# Patient Record
Sex: Female | Born: 1990 | Race: Black or African American | Hispanic: No | Marital: Married | Smoking: Never smoker
Health system: Southern US, Community
[De-identification: ages and names within clinical notes are randomized; demographics above are authoritative.]

## PROBLEM LIST (undated history)

## (undated) ENCOUNTER — Inpatient Hospital Stay (HOSPITAL_COMMUNITY): Payer: Self-pay

## (undated) DIAGNOSIS — IMO0002 Reserved for concepts with insufficient information to code with codable children: Secondary | ICD-10-CM

## (undated) DIAGNOSIS — I10 Essential (primary) hypertension: Secondary | ICD-10-CM

## (undated) DIAGNOSIS — R87619 Unspecified abnormal cytological findings in specimens from cervix uteri: Secondary | ICD-10-CM

## (undated) HISTORY — PX: TONSILLECTOMY: SUR1361

## (undated) HISTORY — PX: HERNIA REPAIR: SHX51

## (undated) HISTORY — PX: ADENOIDECTOMY: SUR15

---

## 2010-04-06 ENCOUNTER — Emergency Department (HOSPITAL_BASED_OUTPATIENT_CLINIC_OR_DEPARTMENT_OTHER): Admission: EM | Admit: 2010-04-06 | Discharge: 2010-04-06 | Payer: Self-pay | Admitting: Emergency Medicine

## 2010-07-23 ENCOUNTER — Emergency Department (HOSPITAL_BASED_OUTPATIENT_CLINIC_OR_DEPARTMENT_OTHER)
Admission: EM | Admit: 2010-07-23 | Discharge: 2010-07-23 | Disposition: A | Discharge status: Home / Self Care | Payer: Medicaid Other | Attending: Emergency Medicine | Admitting: Emergency Medicine

## 2010-07-23 DIAGNOSIS — X58XXXA Exposure to other specified factors, initial encounter: Secondary | ICD-10-CM | POA: Insufficient documentation

## 2010-07-23 DIAGNOSIS — IMO0002 Reserved for concepts with insufficient information to code with codable children: Secondary | ICD-10-CM | POA: Insufficient documentation

## 2010-07-23 DIAGNOSIS — O2690 Pregnancy related conditions, unspecified, unspecified trimester: Secondary | ICD-10-CM | POA: Insufficient documentation

## 2010-07-23 LAB — URINALYSIS, ROUTINE W REFLEX MICROSCOPIC
Protein, ur: NEGATIVE mg/dL
Specific Gravity, Urine: 1.016 (ref 1.005–1.030)
Urine Glucose, Fasting: NEGATIVE mg/dL
pH: 7.5 (ref 5.0–8.0)

## 2010-07-23 LAB — URINE MICROSCOPIC-ADD ON

## 2010-07-24 LAB — GC/CHLAMYDIA PROBE AMP, GENITAL: Chlamydia, DNA Probe: NEGATIVE

## 2010-09-04 ENCOUNTER — Emergency Department (HOSPITAL_BASED_OUTPATIENT_CLINIC_OR_DEPARTMENT_OTHER)
Admission: EM | Admit: 2010-09-04 | Discharge: 2010-09-04 | Disposition: A | Discharge status: Home / Self Care | Payer: Medicaid Other | Attending: Emergency Medicine | Admitting: Emergency Medicine

## 2010-09-04 DIAGNOSIS — R109 Unspecified abdominal pain: Secondary | ICD-10-CM | POA: Insufficient documentation

## 2010-09-04 DIAGNOSIS — N390 Urinary tract infection, site not specified: Secondary | ICD-10-CM | POA: Insufficient documentation

## 2010-09-04 DIAGNOSIS — O2390 Unspecified genitourinary tract infection in pregnancy, unspecified trimester: Secondary | ICD-10-CM | POA: Insufficient documentation

## 2010-09-04 LAB — URINALYSIS, ROUTINE W REFLEX MICROSCOPIC
Glucose, UA: NEGATIVE mg/dL
Nitrite: NEGATIVE
Specific Gravity, Urine: 1.025 (ref 1.005–1.030)
pH: 5.5 (ref 5.0–8.0)

## 2010-09-04 LAB — URINE MICROSCOPIC-ADD ON

## 2010-10-01 ENCOUNTER — Inpatient Hospital Stay (HOSPITAL_COMMUNITY)
Admission: AD | Admit: 2010-10-01 | Discharge: 2010-10-02 | Disposition: A | Discharge status: Home / Self Care | Payer: Medicaid Other | Source: Ambulatory Visit | Attending: Obstetrics & Gynecology | Admitting: Obstetrics & Gynecology

## 2010-10-01 DIAGNOSIS — O479 False labor, unspecified: Secondary | ICD-10-CM | POA: Insufficient documentation

## 2010-10-05 ENCOUNTER — Inpatient Hospital Stay (HOSPITAL_COMMUNITY)
Admission: AD | Admit: 2010-10-05 | Discharge: 2010-10-05 | Disposition: A | Discharge status: Home / Self Care | Payer: Medicaid Other | Source: Ambulatory Visit | Attending: Obstetrics and Gynecology | Admitting: Obstetrics and Gynecology

## 2010-10-05 ENCOUNTER — Emergency Department (HOSPITAL_BASED_OUTPATIENT_CLINIC_OR_DEPARTMENT_OTHER)
Admission: EM | Admit: 2010-10-05 | Discharge: 2010-10-05 | Disposition: A | Discharge status: Home / Self Care | Payer: Medicaid Other | Attending: Emergency Medicine | Admitting: Emergency Medicine

## 2010-10-05 DIAGNOSIS — M545 Low back pain, unspecified: Secondary | ICD-10-CM | POA: Insufficient documentation

## 2010-10-05 DIAGNOSIS — O2690 Pregnancy related conditions, unspecified, unspecified trimester: Secondary | ICD-10-CM | POA: Insufficient documentation

## 2010-10-05 DIAGNOSIS — I10 Essential (primary) hypertension: Secondary | ICD-10-CM | POA: Insufficient documentation

## 2010-10-05 DIAGNOSIS — O4700 False labor before 37 completed weeks of gestation, unspecified trimester: Secondary | ICD-10-CM | POA: Insufficient documentation

## 2010-10-05 LAB — URINALYSIS, ROUTINE W REFLEX MICROSCOPIC
Glucose, UA: NEGATIVE mg/dL
Protein, ur: NEGATIVE mg/dL
pH: 7.5 (ref 5.0–8.0)

## 2010-10-07 LAB — URINE CULTURE: Colony Count: NO GROWTH

## 2010-10-17 ENCOUNTER — Inpatient Hospital Stay (HOSPITAL_COMMUNITY)
Admission: AD | Admit: 2010-10-17 | Discharge: 2010-10-18 | Disposition: A | Discharge status: Home / Self Care | Payer: Medicaid Other | Source: Ambulatory Visit | Attending: Obstetrics & Gynecology | Admitting: Obstetrics & Gynecology

## 2010-10-17 DIAGNOSIS — O479 False labor, unspecified: Secondary | ICD-10-CM | POA: Insufficient documentation

## 2010-10-17 LAB — CBC
Hemoglobin: 10.9 g/dL — ABNORMAL LOW (ref 12.0–15.0)
MCH: 32.7 pg (ref 26.0–34.0)
MCV: 98.2 fL (ref 78.0–100.0)
Platelets: 294 10*3/uL (ref 150–400)
RBC: 3.33 MIL/uL — ABNORMAL LOW (ref 3.87–5.11)

## 2010-10-18 LAB — COMPREHENSIVE METABOLIC PANEL
BUN: 5 mg/dL — ABNORMAL LOW (ref 6–23)
CO2: 19 mEq/L (ref 19–32)
Chloride: 101 mEq/L (ref 96–112)
Creatinine, Ser: 0.65 mg/dL (ref 0.4–1.2)
GFR calc non Af Amer: >60 mL/min (ref >60)
Glucose, Bld: 64 mg/dL — ABNORMAL LOW (ref 70–99)
Total Bilirubin: 0.6 mg/dL (ref 0.3–1.2)

## 2010-10-21 ENCOUNTER — Inpatient Hospital Stay (HOSPITAL_COMMUNITY)
Admission: AD | Admit: 2010-10-21 | Discharge: 2010-10-24 | DRG: 775 | Disposition: A | Discharge status: Home / Self Care | Payer: Managed Care, Other (non HMO) | Source: Ambulatory Visit | Attending: Obstetrics and Gynecology | Admitting: Obstetrics and Gynecology

## 2010-10-21 DIAGNOSIS — Z22330 Carrier of Group B streptococcus: Secondary | ICD-10-CM

## 2010-10-21 DIAGNOSIS — O99892 Other specified diseases and conditions complicating childbirth: Secondary | ICD-10-CM | POA: Diagnosis present

## 2010-10-22 LAB — COMPREHENSIVE METABOLIC PANEL
ALT: 11 U/L (ref 0–35)
Albumin: 2 g/dL — ABNORMAL LOW (ref 3.5–5.2)
Alkaline Phosphatase: 289 U/L — ABNORMAL HIGH (ref 39–117)
BUN: 5 mg/dL — ABNORMAL LOW (ref 6–23)
Chloride: 98 mEq/L (ref 96–112)
Glucose, Bld: 87 mg/dL (ref 70–99)
Potassium: 3.6 mEq/L (ref 3.5–5.1)
Total Bilirubin: 0.8 mg/dL (ref 0.3–1.2)

## 2010-10-22 LAB — CBC
HCT: 31.3 % — ABNORMAL LOW (ref 36.0–46.0)
Hemoglobin: 11.6 g/dL — ABNORMAL LOW (ref 12.0–15.0)
MCV: 100 fL (ref 78.0–100.0)
RBC: 3.13 MIL/uL — ABNORMAL LOW (ref 3.87–5.11)
RBC: 3.47 MIL/uL — ABNORMAL LOW (ref 3.87–5.11)
WBC: 13.9 10*3/uL — ABNORMAL HIGH (ref 4.0–10.5)
WBC: 15.8 10*3/uL — ABNORMAL HIGH (ref 4.0–10.5)

## 2010-10-22 LAB — URIC ACID: Uric Acid, Serum: 6.1 mg/dL (ref 2.4–7.0)

## 2010-10-22 LAB — LACTATE DEHYDROGENASE: LDH: 203 U/L (ref 94–250)

## 2010-10-23 LAB — CBC
HCT: 27.5 % — ABNORMAL LOW (ref 36.0–46.0)
MCH: 32.9 pg (ref 26.0–34.0)
MCV: 98.2 fL (ref 78.0–100.0)
Platelets: 281 10*3/uL (ref 150–400)
RBC: 2.8 MIL/uL — ABNORMAL LOW (ref 3.87–5.11)

## 2012-06-10 NOTE — L&D Delivery Note (Signed)
Patient was C/C/+2 and pushed for 5 minutes with epidural.   NSVD  female infant, Apgars 9,9, weight P.   The patient had one first degree L labial lacerations repaired with 2-0 vicryl. Fundus was firm. EBL was expected. Placenta was delivered intact. Vagina was clear.  Baby was vigorous to bedside.  Treven Holtman A

## 2012-09-16 LAB — OB RESULTS CONSOLE ABO/RH: RH Type: POSITIVE

## 2012-09-16 LAB — OB RESULTS CONSOLE RUBELLA ANTIBODY, IGM: Rubella: IMMUNE

## 2012-09-16 LAB — OB RESULTS CONSOLE RPR: RPR: NONREACTIVE

## 2012-09-16 LAB — OB RESULTS CONSOLE HEPATITIS B SURFACE ANTIGEN: Hepatitis B Surface Ag: NEGATIVE

## 2012-09-16 LAB — OB RESULTS CONSOLE HIV ANTIBODY (ROUTINE TESTING): HIV: NONREACTIVE

## 2013-03-20 ENCOUNTER — Inpatient Hospital Stay (HOSPITAL_COMMUNITY)
Admission: AD | Admit: 2013-03-20 | Discharge: 2013-03-22 | DRG: 775 | Disposition: A | Discharge status: Home / Self Care | Payer: Managed Care, Other (non HMO) | Source: Ambulatory Visit | Attending: Obstetrics and Gynecology | Admitting: Obstetrics and Gynecology

## 2013-03-20 ENCOUNTER — Encounter (HOSPITAL_COMMUNITY): Payer: Managed Care, Other (non HMO) | Admitting: Anesthesiology

## 2013-03-20 ENCOUNTER — Inpatient Hospital Stay (HOSPITAL_COMMUNITY): Payer: Managed Care, Other (non HMO) | Admitting: Anesthesiology

## 2013-03-20 ENCOUNTER — Encounter (HOSPITAL_COMMUNITY): Payer: Self-pay

## 2013-03-20 HISTORY — DX: Reserved for concepts with insufficient information to code with codable children: IMO0002

## 2013-03-20 HISTORY — DX: Essential (primary) hypertension: I10

## 2013-03-20 HISTORY — DX: Unspecified abnormal cytological findings in specimens from cervix uteri: R87.619

## 2013-03-20 LAB — CBC
HCT: 32.6 % — ABNORMAL LOW (ref 36.0–46.0)
Hemoglobin: 11 g/dL — ABNORMAL LOW (ref 12.0–15.0)
MCH: 31.7 pg (ref 26.0–34.0)
MCHC: 33.7 g/dL (ref 30.0–36.0)
MCV: 93.9 fL (ref 78.0–100.0)
RBC: 3.47 MIL/uL — ABNORMAL LOW (ref 3.87–5.11)
RDW: 12.7 % (ref 11.5–15.5)

## 2013-03-20 MED ORDER — OXYTOCIN BOLUS FROM INFUSION: 500.0000 mL | INTRAVENOUS | Status: DC

## 2013-03-20 MED ORDER — LACTATED RINGERS IV SOLN: 500.0000 mL | Freq: Once | INTRAVENOUS | Status: DC

## 2013-03-20 MED ORDER — ONDANSETRON HCL 4 MG/2ML IJ SOLN: 4.0000 mg | Freq: Four times a day (QID) | INTRAMUSCULAR | Status: DC | PRN

## 2013-03-20 MED ORDER — EPHEDRINE 5 MG/ML INJ
10.0000 mg | INTRAVENOUS | Status: DC | PRN
  Filled 2013-03-20: qty 2

## 2013-03-20 MED ORDER — EPHEDRINE 5 MG/ML INJ
10.0000 mg | INTRAVENOUS | Status: DC | PRN
  Filled 2013-03-20: qty 4
  Filled 2013-03-20: qty 2

## 2013-03-20 MED ORDER — PHENYLEPHRINE 40 MCG/ML (10ML) SYRINGE FOR IV PUSH (FOR BLOOD PRESSURE SUPPORT)
80.0000 ug | PREFILLED_SYRINGE | INTRAVENOUS | Status: DC | PRN
  Filled 2013-03-20: qty 2

## 2013-03-20 MED ORDER — ACETAMINOPHEN 325 MG PO TABS: 650.0000 mg | ORAL_TABLET | ORAL | Status: DC | PRN

## 2013-03-20 MED ORDER — OXYTOCIN 40 UNITS IN LACTATED RINGERS INFUSION - SIMPLE MED
1.0000 m[IU]/min | INTRAVENOUS | Status: DC
  Administered 2013-03-20: 2 m[IU]/min via INTRAVENOUS
  Filled 2013-03-20: qty 1000

## 2013-03-20 MED ORDER — CITRIC ACID-SODIUM CITRATE 334-500 MG/5ML PO SOLN: 30.0000 mL | ORAL | Status: DC | PRN

## 2013-03-20 MED ORDER — OXYTOCIN 40 UNITS IN LACTATED RINGERS INFUSION - SIMPLE MED: 62.5000 mL/h | INTRAVENOUS | Status: DC

## 2013-03-20 MED ORDER — LACTATED RINGERS IV SOLN: 500.0000 mL | INTRAVENOUS | Status: DC | PRN

## 2013-03-20 MED ORDER — PENICILLIN G POTASSIUM 5000000 UNITS IJ SOLR
2.5000 10*6.[IU] | INTRAVENOUS | Status: DC
  Administered 2013-03-20 (×2): 2.5 10*6.[IU] via INTRAVENOUS
  Filled 2013-03-20 (×6): qty 2.5

## 2013-03-20 MED ORDER — FENTANYL 2.5 MCG/ML BUPIVACAINE 1/10 % EPIDURAL INFUSION (WH - ANES)
14.0000 mL/h | INTRAMUSCULAR | Status: DC | PRN
  Administered 2013-03-20: 14 mL/h via EPIDURAL
  Administered 2013-03-20: 12 mL/h via EPIDURAL
  Filled 2013-03-20 (×2): qty 125

## 2013-03-20 MED ORDER — FLEET ENEMA 7-19 GM/118ML RE ENEM: 1.0000 | ENEMA | RECTAL | Status: DC | PRN

## 2013-03-20 MED ORDER — PHENYLEPHRINE 40 MCG/ML (10ML) SYRINGE FOR IV PUSH (FOR BLOOD PRESSURE SUPPORT)
80.0000 ug | PREFILLED_SYRINGE | INTRAVENOUS | Status: DC | PRN
  Filled 2013-03-20: qty 5
  Filled 2013-03-20: qty 2

## 2013-03-20 MED ORDER — LIDOCAINE HCL (PF) 1 % IJ SOLN
30.0000 mL | INTRAMUSCULAR | Status: DC | PRN
  Filled 2013-03-20 (×2): qty 30

## 2013-03-20 MED ORDER — DIPHENHYDRAMINE HCL 50 MG/ML IJ SOLN
12.5000 mg | INTRAMUSCULAR | Status: AC | PRN
  Administered 2013-03-20 (×3): 12.5 mg via INTRAVENOUS
  Filled 2013-03-20: qty 1

## 2013-03-20 MED ORDER — OXYCODONE-ACETAMINOPHEN 5-325 MG PO TABS: 1.0000 | ORAL_TABLET | ORAL | Status: DC | PRN

## 2013-03-20 MED ORDER — PENICILLIN G POTASSIUM 5000000 UNITS IJ SOLR
5.0000 10*6.[IU] | Freq: Once | INTRAVENOUS | Status: AC
  Administered 2013-03-20: 5 10*6.[IU] via INTRAVENOUS
  Filled 2013-03-20: qty 5

## 2013-03-20 MED ORDER — LIDOCAINE HCL (PF) 1 % IJ SOLN
INTRAMUSCULAR | Status: DC | PRN
  Administered 2013-03-20 (×3): 4 mL

## 2013-03-20 MED ORDER — IBUPROFEN 600 MG PO TABS
600.0000 mg | ORAL_TABLET | Freq: Four times a day (QID) | ORAL | Status: DC | PRN
  Administered 2013-03-21: 600 mg via ORAL
  Filled 2013-03-20: qty 1

## 2013-03-20 MED ORDER — TERBUTALINE SULFATE 1 MG/ML IJ SOLN: 0.2500 mg | Freq: Once | INTRAMUSCULAR | Status: AC | PRN

## 2013-03-20 MED ORDER — LACTATED RINGERS IV SOLN
INTRAVENOUS | Status: DC
  Administered 2013-03-20: 23:00:00 via INTRAVENOUS
  Administered 2013-03-20: 125 mL/h via INTRAVENOUS

## 2013-03-20 NOTE — Anesthesia Preprocedure Evaluation (Signed)

## 2013-03-20 NOTE — Progress Notes (Signed)
Pt still concerning regarding breathing CRNA called and given history.  Requested CRNA to come and check pt

## 2013-03-20 NOTE — MAU Provider Note (Signed)
S: 22 y.o. G2P1 @ [redacted]w[redacted]d presents to MAU for labor evaluation.  She reports leakage of fluid and blood into toilet at home.  She has not leaked since this occurrence and is not requiring a pad for leakage.  She reports good fetal movement, denies vaginal itching/burning, urinary symptoms, h/a, dizziness, n/v, or fever/chills.    O: BP 128/73  Pulse 112  Temp(Src) 98.1 F (36.7 C) (Oral)  Resp 16  Ht 4\' 11"  (1.499 m)  Wt 71.442 kg (157 lb 8 oz)  BMI 31.79 kg/m2  Dilation: 4 Effacement (%): 70 Cervical Position: Middle Station: -3 Presentation: Vertex Exam by:: Leftwich-Kirby, CNM  Speculum exam: Negative pooling with cough/bearing down, scant/small amount of dark brown blood mixed with mucus noted, scant brown mucus on glove following exam  Ferning negative  A: Labor evaluation  P: RN to recheck, call Dr Nils Flack Certified Nurse-Midwife

## 2013-03-20 NOTE — Progress Notes (Signed)
Pt changed to 5 cm;  Admit for labor.

## 2013-03-20 NOTE — MAU Note (Signed)
22 yo, G2P1 at [redacted]w[redacted]d, presents to MAU with c/o seeing a small blood clot this morning with wiping. Last intercourse Tuesday; reports she was 3 cm at visit on Tuesday. Reports having some contractions this morning. Reports +FM. Denies HSV; denies LOF.

## 2013-03-20 NOTE — Progress Notes (Signed)
22 y.o. [name] comes in c/o labor and possible LOF. Otherwise has good fetal movement and no bleeding.  History reviewed. No pertinent past medical history.  Past Surgical History   Procedure  Laterality  Date   .  Hernia repair     .  Tonsillectomy     .  Adenoidectomy      OB History   Gravida  Para  Term  Preterm  AB  SAB  TAB  Ectopic  Multiple  Living   2  1         1     #  Outcome  Date  GA  Lbr Len/2nd  Weight  Sex  Delivery  Anes  PTL  Lv   2  CUR            1  PAR  2012     M  SVD   N  Y      History    Social History   .  Marital Status:  Single     Spouse Name:  N/[name]     Number of Children:  N/[name]   .  Years of Education:  N/[name]    Occupational History   .  Not on file.    Social History Main Topics   .  Smoking status:  Never Smoker   .  Smokeless tobacco:  Never Used   .  Alcohol Use:  No   .  Drug Use:  No   .  Sexual Activity:  Yes     Birth Control/ Protection:  None    Other Topics  Concern   .  Not on file    Social History Narrative   .  No narrative on file   Review of patient's allergies indicates no known allergies.  Prenatal Transfer Tool   Maternal Diabetes: No  Genetic Screening: Normal  Maternal Ultrasounds/Referrals: Normal  Fetal Ultrasounds or other Referrals: None  Maternal Substance Abuse: No  Significant Maternal Medications: None  Significant Maternal Lab Results: None  Other PNC:uncomp  Filed Vitals:    03/20/13 0820   BP:  128/73   Pulse:  112   Temp:  98.1 F (36.7 C)   Resp:  16    Lungs/Cor: NAD  Abdomen: soft, gravid  Ex: no cords, erythema  SVE: 4/70/-2  SSE per NP no ferning or pool- not ruptured.  FHTs: 130, good STV, NST R  Toco: q10  [name], early labor vs BRHX. No LOF. Pt to walk and return for recheck.  GBS pos.  Michele [name]  

## 2013-03-20 NOTE — Progress Notes (Signed)
Pt states she is having increased difficulty breathing.  Pt placed on pulse ox.  Instructed she is oxygenating at 99%.  Exlplained to pt that with epidural sometimes some of the accessory muscles used for breathing are relaxed  Pt verbalized understanding

## 2013-03-20 NOTE — Anesthesia Procedure Notes (Signed)
Epidural Patient location during procedure: OB Start time: 03/20/2013 1:03 PM  Staffing Performed by: anesthesiologist   Preanesthetic Checklist Completed: patient identified, site marked, surgical consent, pre-op evaluation, timeout performed, IV checked, risks and benefits discussed and monitors and equipment checked  Epidural Patient position: sitting Prep: site prepped and draped and DuraPrep Patient monitoring: continuous pulse ox and blood pressure Approach: midline Injection technique: LOR air  Needle:  Needle type: Tuohy  Needle gauge: 17 G Needle length: 9 cm and 9 Needle insertion depth: 4.5 cm Catheter type: closed end flexible Catheter size: 19 Gauge Catheter at skin depth: 9.5 cm Test dose: negative  Assessment Events: blood not aspirated, injection not painful, no injection resistance, negative IV test and no paresthesia  Additional Notes Discussed risk of headache, infection, bleeding, nerve injury and failed or incomplete block.  Patient voices understanding and wishes to proceed.  Epidural placed easily on first attempt.  No paresthesia.  Patient tolerated procedure well with no apparent complications. Jasmine December, MD Reason for block:procedure for pain

## 2013-03-20 NOTE — Progress Notes (Signed)
CRNA in room to check on pt

## 2013-03-20 NOTE — MAU Note (Signed)
Pt states here for possible ROM this am. Clear fluid mixed with small amount of blood. Did note small clot and was concerned. Was 3cm in office Tuesday.

## 2013-03-21 ENCOUNTER — Encounter (HOSPITAL_COMMUNITY): Payer: Self-pay | Admitting: *Deleted

## 2013-03-21 LAB — CBC
HCT: 28.3 % — ABNORMAL LOW (ref 36.0–46.0)
Hemoglobin: 9.5 g/dL — ABNORMAL LOW (ref 12.0–15.0)
MCHC: 33.6 g/dL (ref 30.0–36.0)
MCV: 95 fL (ref 78.0–100.0)
RBC: 2.98 MIL/uL — ABNORMAL LOW (ref 3.87–5.11)
WBC: 13.1 10*3/uL — ABNORMAL HIGH (ref 4.0–10.5)

## 2013-03-21 MED ORDER — MAGNESIUM HYDROXIDE 400 MG/5ML PO SUSP: 30.0000 mL | ORAL | Status: DC | PRN

## 2013-03-21 MED ORDER — LANOLIN HYDROUS EX OINT: TOPICAL_OINTMENT | CUTANEOUS | Status: DC | PRN

## 2013-03-21 MED ORDER — OXYCODONE-ACETAMINOPHEN 5-325 MG PO TABS
1.0000 | ORAL_TABLET | ORAL | Status: DC | PRN
  Administered 2013-03-21 (×3): 1 via ORAL
  Filled 2013-03-21 (×3): qty 1

## 2013-03-21 MED ORDER — METHYLERGONOVINE MALEATE 0.2 MG PO TABS: 0.2000 mg | ORAL_TABLET | ORAL | Status: DC | PRN

## 2013-03-21 MED ORDER — ONDANSETRON HCL 4 MG/2ML IJ SOLN: 4.0000 mg | INTRAMUSCULAR | Status: DC | PRN

## 2013-03-21 MED ORDER — SENNOSIDES-DOCUSATE SODIUM 8.6-50 MG PO TABS
2.0000 | ORAL_TABLET | ORAL | Status: DC
  Administered 2013-03-22: 2 via ORAL
  Filled 2013-03-21: qty 2

## 2013-03-21 MED ORDER — ONDANSETRON HCL 4 MG PO TABS: 4.0000 mg | ORAL_TABLET | ORAL | Status: DC | PRN

## 2013-03-21 MED ORDER — BENZOCAINE-MENTHOL 20-0.5 % EX AERO
1.0000 "application " | INHALATION_SPRAY | CUTANEOUS | Status: DC | PRN
  Administered 2013-03-21: 1 via TOPICAL
  Filled 2013-03-21: qty 56

## 2013-03-21 MED ORDER — MEASLES, MUMPS & RUBELLA VAC ~~LOC~~ INJ
0.5000 mL | INJECTION | Freq: Once | SUBCUTANEOUS | Status: DC
  Filled 2013-03-21: qty 0.5

## 2013-03-21 MED ORDER — DIBUCAINE 1 % RE OINT: 1.0000 "application " | TOPICAL_OINTMENT | RECTAL | Status: DC | PRN

## 2013-03-21 MED ORDER — SIMETHICONE 80 MG PO CHEW: 80.0000 mg | CHEWABLE_TABLET | ORAL | Status: DC | PRN

## 2013-03-21 MED ORDER — METHYLERGONOVINE MALEATE 0.2 MG/ML IJ SOLN
0.2000 mg | INTRAMUSCULAR | Status: DC | PRN
Start: 2013-03-21 — End: 2013-03-22

## 2013-03-21 MED ORDER — ZOLPIDEM TARTRATE 5 MG PO TABS: 5.0000 mg | ORAL_TABLET | Freq: Every evening | ORAL | Status: DC | PRN

## 2013-03-21 MED ORDER — TETANUS-DIPHTH-ACELL PERTUSSIS 5-2.5-18.5 LF-MCG/0.5 IM SUSP: 0.5000 mL | Freq: Once | INTRAMUSCULAR | Status: DC

## 2013-03-21 MED ORDER — FERROUS SULFATE 325 (65 FE) MG PO TABS
325.0000 mg | ORAL_TABLET | Freq: Two times a day (BID) | ORAL | Status: DC
  Administered 2013-03-21 – 2013-03-22 (×3): 325 mg via ORAL
  Filled 2013-03-21 (×3): qty 1

## 2013-03-21 MED ORDER — PRENATAL MULTIVITAMIN CH
1.0000 | ORAL_TABLET | Freq: Every day | ORAL | Status: DC
  Administered 2013-03-21 – 2013-03-22 (×2): 1 via ORAL
  Filled 2013-03-21 (×2): qty 1

## 2013-03-21 MED ORDER — DIPHENHYDRAMINE HCL 25 MG PO CAPS: 25.0000 mg | ORAL_CAPSULE | Freq: Four times a day (QID) | ORAL | Status: DC | PRN

## 2013-03-21 MED ORDER — WITCH HAZEL-GLYCERIN EX PADS
1.0000 "application " | MEDICATED_PAD | CUTANEOUS | Status: DC | PRN
  Administered 2013-03-21: 1 via TOPICAL

## 2013-03-21 MED ORDER — SODIUM CHLORIDE 0.9 % IJ SOLN: 3.0000 mL | INTRAMUSCULAR | Status: DC | PRN

## 2013-03-21 MED ORDER — SODIUM CHLORIDE 0.9 % IV SOLN: 250.0000 mL | INTRAVENOUS | Status: DC | PRN

## 2013-03-21 MED ORDER — SODIUM CHLORIDE 0.9 % IJ SOLN: 3.0000 mL | Freq: Two times a day (BID) | INTRAMUSCULAR | Status: DC

## 2013-03-21 MED ORDER — IBUPROFEN 800 MG PO TABS
800.0000 mg | ORAL_TABLET | Freq: Three times a day (TID) | ORAL | Status: DC
  Administered 2013-03-21 – 2013-03-22 (×4): 800 mg via ORAL
  Filled 2013-03-21 (×4): qty 1

## 2013-03-21 NOTE — Anesthesia Postprocedure Evaluation (Signed)
Anesthesia Post Note  Patient: Michele [name]  Procedure(s) Performed: * No procedures listed *  Anesthesia type: Epidural  Patient location: Mother/Baby  Post pain: Pain level controlled  Post assessment: Post-op Vital signs reviewed  Last Vitals:  Filed Vitals:   03/21/13 0415  BP: 120/76  Pulse: 87  Temp: 36.7 C  Resp: 18    Post vital signs: Reviewed  Level of consciousness:alert  Complications: No apparent anesthesia complications

## 2013-03-21 NOTE — Progress Notes (Signed)
Patient is eating, ambulating, voiding.  Pain control is good.  Filed Vitals:   03/21/13 0231 03/21/13 0315 03/21/13 0415 03/21/13 0915  BP: 136/86 127/97 120/76 129/72  Pulse: 96 83 87 71  Temp:  98.5 F (36.9 C) 98.1 F (36.7 C) 97.6 F (36.4 C)  TempSrc:  Oral Oral Oral  Resp: 18 20 18 18   Height:      Weight:      SpO2:  96% 97%     Fundus firm Perineum without swelling.  Lab Results  Component Value Date   WBC 13.1* 03/21/2013   HGB 9.5* 03/21/2013   HCT 28.3* 03/21/2013   MCV 95.0 03/21/2013   PLT 190 03/21/2013    O/Positive/-- (04/09 0000)/RI  A/P Post partum day 1.  Routine care.  Expect d/c tomorrow.  Circ at office  Monaye Blackie A

## 2013-03-22 NOTE — Discharge Summary (Signed)
Obstetric Discharge Summary Reason for Admission: onset of labor Prenatal Procedures: ultrasound Intrapartum Procedures: spontaneous vaginal delivery Postpartum Procedures: none Complications-Operative and Postpartum: 1st degree labial Hemoglobin  Date Value Range Status  03/21/2013 9.5* 12.0 - 15.0 g/dL Final     HCT  Date Value Range Status  03/21/2013 28.3* 36.0 - 46.0 % Final    Physical Exam:  General: alert and cooperative Lochia: appropriate Uterine Fundus: firm DVT Evaluation: No evidence of DVT seen on physical exam.  Discharge Diagnoses: Term Pregnancy-delivered  Discharge Information: Date: 03/22/2013 Activity: pelvic rest Diet: routine Medications: PNV and Ibuprofen Condition: stable Instructions: refer to practice specific booklet Discharge to: home Follow-up Information   Follow up with [name],[name], MD In 4 weeks.   Specialty:  Obstetrics and Gynecology   Contact information:   [address] [place] [address] [phone]       Newborn Data: Live born female  Birth Weight: 6 lb 10.5 oz (3020 g) APGAR: 9, 9  Home with mother.  Michele [name] 03/22/2013, 10:14 AM

## 2013-03-30 NOTE — H&P (Signed)
22 y.o. [name] comes in c/o labor and possible LOF. Otherwise has good fetal movement and no bleeding.  History reviewed. No pertinent past medical history.  Past Surgical History   Procedure  Laterality  Date   .  Hernia repair     .  Tonsillectomy     .  Adenoidectomy      OB History   Gravida  Para  Term  Preterm  AB  SAB  TAB  Ectopic  Multiple  Living   2  1         1      #  Outcome  Date  GA  Lbr Len/2nd  Weight  Sex  Delivery  Anes  PTL  Lv   2  CUR            1  PAR  2012     M  SVD   N  Y      History    Social History   .  Marital Status:  Single     Spouse Name:  N/[name]     Number of Children:  N/[name]   .  Years of Education:  N/[name]    Occupational History   .  Not on file.    Social History Main Topics   .  Smoking status:  Never Smoker   .  Smokeless tobacco:  Never Used   .  Alcohol Use:  No   .  Drug Use:  No   .  Sexual Activity:  Yes     Birth Control/ Protection:  None    Other Topics  Concern   .  Not on file    Social History Narrative   .  No narrative on file   Review of patient's allergies indicates no known allergies.  Prenatal Transfer Tool   Maternal Diabetes: No  Genetic Screening: Normal  Maternal Ultrasounds/Referrals: Normal  Fetal Ultrasounds or other Referrals: None  Maternal Substance Abuse: No  Significant Maternal Medications: None  Significant Maternal Lab Results: None  Other JYN:WGNFAO  Filed Vitals:    03/20/13 0820   BP:  128/73   Pulse:  112   Temp:  98.1 F (36.7 C)   Resp:  16    Lungs/Cor: NAD  Abdomen: soft, gravid  Ex: no cords, erythema  SVE: 4/70/-2  SSE per NP no ferning or pool- not ruptured.  FHTs: 130, good STV, NST R  Toco: q10  [name], early labor vs BRHX. No LOF. Pt to walk and return for recheck.  GBS pos.  Michele [name]

## 2013-10-24 ENCOUNTER — Emergency Department (HOSPITAL_BASED_OUTPATIENT_CLINIC_OR_DEPARTMENT_OTHER)
Admission: EM | Admit: 2013-10-24 | Discharge: 2013-10-25 | Disposition: A | Discharge status: Home / Self Care | Payer: Managed Care, Other (non HMO) | Attending: Emergency Medicine | Admitting: Emergency Medicine

## 2013-10-24 ENCOUNTER — Encounter (HOSPITAL_BASED_OUTPATIENT_CLINIC_OR_DEPARTMENT_OTHER): Payer: Self-pay | Admitting: Emergency Medicine

## 2013-10-24 ENCOUNTER — Emergency Department (HOSPITAL_BASED_OUTPATIENT_CLINIC_OR_DEPARTMENT_OTHER): Payer: Managed Care, Other (non HMO)

## 2013-10-24 DIAGNOSIS — I10 Essential (primary) hypertension: Secondary | ICD-10-CM | POA: Insufficient documentation

## 2013-10-24 DIAGNOSIS — R109 Unspecified abdominal pain: Secondary | ICD-10-CM

## 2013-10-24 DIAGNOSIS — R519 Headache, unspecified: Secondary | ICD-10-CM

## 2013-10-24 DIAGNOSIS — Z3202 Encounter for pregnancy test, result negative: Secondary | ICD-10-CM | POA: Insufficient documentation

## 2013-10-24 DIAGNOSIS — R1013 Epigastric pain: Secondary | ICD-10-CM | POA: Insufficient documentation

## 2013-10-24 DIAGNOSIS — R112 Nausea with vomiting, unspecified: Secondary | ICD-10-CM | POA: Insufficient documentation

## 2013-10-24 DIAGNOSIS — R197 Diarrhea, unspecified: Secondary | ICD-10-CM | POA: Insufficient documentation

## 2013-10-24 DIAGNOSIS — R51 Headache: Secondary | ICD-10-CM | POA: Insufficient documentation

## 2013-10-24 DIAGNOSIS — Z8742 Personal history of other diseases of the female genital tract: Secondary | ICD-10-CM | POA: Insufficient documentation

## 2013-10-24 LAB — COMPREHENSIVE METABOLIC PANEL
ALT: 11 U/L (ref 0–35)
AST: 19 U/L (ref 0–37)
Albumin: 4.2 g/dL (ref 3.5–5.2)
Alkaline Phosphatase: 94 U/L (ref 39–117)
BUN: 12 mg/dL (ref 6–23)
CHLORIDE: 104 meq/L (ref 96–112)
CO2: 20 mEq/L (ref 19–32)
CREATININE: 0.6 mg/dL (ref 0.50–1.10)
Calcium: 8.9 mg/dL (ref 8.4–10.5)
GFR calc Af Amer: >90 mL/min (ref >90)
Glucose, Bld: 136 mg/dL — ABNORMAL HIGH (ref 70–99)
POTASSIUM: 4.1 meq/L (ref 3.7–5.3)
Sodium: 139 mEq/L (ref 137–147)
Total Bilirubin: 0.4 mg/dL (ref 0.3–1.2)
Total Protein: 7.4 g/dL (ref 6.0–8.3)

## 2013-10-24 LAB — URINALYSIS, ROUTINE W REFLEX MICROSCOPIC
Glucose, UA: NEGATIVE mg/dL
Hgb urine dipstick: NEGATIVE
LEUKOCYTES UA: NEGATIVE
NITRITE: NEGATIVE
Protein, ur: NEGATIVE mg/dL
Specific Gravity, Urine: 1.035 — ABNORMAL HIGH (ref 1.005–1.030)
UROBILINOGEN UA: 1 mg/dL (ref 0.0–1.0)
pH: 6 (ref 5.0–8.0)

## 2013-10-24 LAB — CBC WITH DIFFERENTIAL/PLATELET
BASOS PCT: 0 % (ref 0–1)
Basophils Absolute: 0 10*3/uL (ref 0.0–0.1)
Eosinophils Absolute: 0 10*3/uL (ref 0.0–0.7)
Eosinophils Relative: 0 % (ref 0–5)
HCT: 36.6 % (ref 36.0–46.0)
Hemoglobin: 12.6 g/dL (ref 12.0–15.0)
Lymphocytes Relative: 5 % — ABNORMAL LOW (ref 12–46)
Lymphs Abs: 0.4 10*3/uL — ABNORMAL LOW (ref 0.7–4.0)
MCH: 32.3 pg (ref 26.0–34.0)
MCHC: 34.4 g/dL (ref 30.0–36.0)
MCV: 93.8 fL (ref 78.0–100.0)
Monocytes Absolute: 0.4 10*3/uL (ref 0.1–1.0)
Monocytes Relative: 5 % (ref 3–12)
NEUTROS ABS: 7.4 10*3/uL (ref 1.7–7.7)
NEUTROS PCT: 90 % — AB (ref 43–77)
Platelets: 233 10*3/uL (ref 150–400)
RBC: 3.9 MIL/uL (ref 3.87–5.11)
RDW: 11.7 % (ref 11.5–15.5)
WBC: 8.2 10*3/uL (ref 4.0–10.5)

## 2013-10-24 LAB — PREGNANCY, URINE: PREG TEST UR: NEGATIVE

## 2013-10-24 LAB — LIPASE, BLOOD: Lipase: 22 U/L (ref 11–59)

## 2013-10-24 MED ORDER — ACETAMINOPHEN 325 MG PO TABS
650.0000 mg | ORAL_TABLET | Freq: Once | ORAL | Status: AC
  Administered 2013-10-24: 650 mg via ORAL
  Filled 2013-10-24: qty 2

## 2013-10-24 MED ORDER — FENTANYL CITRATE 0.05 MG/ML IJ SOLN
100.0000 ug | Freq: Once | INTRAMUSCULAR | Status: AC
  Administered 2013-10-24: 100 ug via INTRAVENOUS
  Filled 2013-10-24: qty 2

## 2013-10-24 MED ORDER — METOCLOPRAMIDE HCL 5 MG/ML IJ SOLN
10.0000 mg | Freq: Once | INTRAMUSCULAR | Status: AC
  Administered 2013-10-24: 10 mg via INTRAVENOUS
  Filled 2013-10-24: qty 2

## 2013-10-24 MED ORDER — DIPHENHYDRAMINE HCL 50 MG/ML IJ SOLN
25.0000 mg | Freq: Once | INTRAMUSCULAR | Status: AC
  Administered 2013-10-24: 25 mg via INTRAVENOUS
  Filled 2013-10-24: qty 1

## 2013-10-24 MED ORDER — SODIUM CHLORIDE 0.9 % IV BOLUS (SEPSIS)
1000.0000 mL | Freq: Once | INTRAVENOUS | Status: AC
  Administered 2013-10-24: 1000 mL via INTRAVENOUS

## 2013-10-24 NOTE — ED Notes (Signed)
Pt states that she had a HA today and she laid down to take a nap, when she woke her stomach was cramping and she started vomiting. Pt states she vomited 5 times today. Pt still has HA as well.

## 2013-10-24 NOTE — ED Provider Notes (Signed)
CSN: [id number]     Arrival date & time 10/24/13  2107 History  This chart was scribed for Michele HornJohn M Mandy Fitzwater, MD by Michele [name], ED Scribe. This patient was seen in room MH05/MH05 and the patient's care was started at 10:51 PM.    Chief Complaint  Patient presents with  . Headache  . Emesis    The history is provided by the patient. No language interpreter was used.    HPI Comments: Michele [name] is a 23 y.o. female who presents to the Emergency Department complaining of gradual onset HAs 3-4 a week lasting for 1-2 hours for the last few months. Pt states she had her typical HA today and took a nap this afternoon. When she woke up, she had some abdominal pain and cramping as well as some nausea, vomiting, and diarrhea. Pt reports constant epigastric abdominal pain. She characterizes her head pain as a gradual throbbing on the left frontal side. Pt denies vaginal bleeding, discharge, burning with urination, lateralized weakness and numbness, changes in speech, vision, and hearing, confusion, dizziness, and lightheadedness.   Past Medical History  Diagnosis Date  . Abnormal Pap smear     repeat was normal  . Hypertension     hx of in 9th, 10th grade; states lifestyle changes helped; was never put on med because "I was too young".    Past Surgical History  Procedure Laterality Date  . Hernia repair    . Tonsillectomy    . Adenoidectomy      History reviewed. No pertinent family history. History  Substance Use Topics  . Smoking status: Never Smoker   . Smokeless tobacco: Never Used  . Alcohol Use: No    OB History   Grav Para Term Preterm Abortions TAB SAB Ect Mult Living   3 3 2  0 0 0 0 0 0 2      Review of Systems 10 Systems reviewed and all are negative for acute change except as noted in the HPI.   Allergies  Review of patient's allergies indicates no known allergies.   Home Medications   Prior to Admission medications   Medication Sig Start Date End Date  Taking? Authorizing Provider  Prenatal Vit-Fe Fumarate-FA (PRENATAL MULTIVITAMIN) TABS tablet Take 1 tablet by mouth daily at 12 noon.    Historical Provider, MD    Triage Vitals: BP 125/79  Pulse 96  Temp(Src) 98.5 F (36.9 C) (Oral)  Resp 16  Ht 4\' 11"  (1.499 m)  Wt 144 lb 5 oz (65.46 kg)  BMI 29.13 kg/m2  SpO2 100%  LMP 10/18/2013   Physical Exam  Nursing note and vitals reviewed. Constitutional: She is oriented to person, place, and time. She appears well-developed and well-nourished. No distress.  Awake, alert, nontoxic appearance with baseline speech for patient.  HENT:  Head: Normocephalic and atraumatic.  Mouth/Throat: No oropharyngeal exudate.  Eyes: EOM are normal. Pupils are equal, round, and reactive to light. Right eye exhibits no discharge. Left eye exhibits no discharge.  Neck: Neck supple.  Cardiovascular: Normal rate, regular rhythm and normal heart sounds.  Exam reveals no gallop and no friction rub.   No murmur heard. Pulmonary/Chest: Effort normal and breath sounds normal. No stridor. No respiratory distress. She has no wheezes. She has no rales. She exhibits no tenderness.  Abdominal: Soft. Bowel sounds are normal. She exhibits no mass. There is tenderness. There is no rebound and no CVA tenderness.  Minimal epigastric tenderness only, the rest of the  abdomen non tender  Musculoskeletal: She exhibits no tenderness.  Baseline ROM, moves extremities with no obvious new focal weakness.  Lymphadenopathy:    She has no cervical adenopathy.  Neurological: She is alert and oriented to person, place, and time.  Awake, alert, cooperative and aware of situation; motor strength bilaterally; sensation normal to light touch bilaterally; peripheral visual fields full to confrontation; no facial asymmetry; tongue midline; major cranial nerves appear intact; no pronator drift, normal finger to nose bilaterally, baseline gait without new ataxia.  Skin: Skin is warm and dry. No  rash noted. She is not diaphoretic.  Psychiatric: She has a normal mood and affect. Her behavior is normal.    ED Course  Procedures (including critical care time)   DIAGNOSTIC STUDIES: Oxygen Saturation is 100% on RA, normal by my interpretation.     COORDINATION OF CARE: 11:00 PM - Patient understands and agrees with initial ED impression and plan with expectations set for ED visit. Abdominal pain resolved in ED, abdomen soft and nontender on repeat examination, the patient feels much improved sleeping comfortably in the emergency department and wakes easily. 0100  Labs Review Labs Reviewed  URINALYSIS, ROUTINE W REFLEX MICROSCOPIC - Abnormal; Notable for the following:    Specific Gravity, Urine 1.035 (*)    Bilirubin Urine SMALL (*)    Ketones, ur >80 (*)    All other components within normal limits  CBC WITH DIFFERENTIAL - Abnormal; Notable for the following:    Neutrophils Relative % 90 (*)    Lymphocytes Relative 5 (*)    Lymphs Abs 0.4 (*)    All other components within normal limits  COMPREHENSIVE METABOLIC PANEL - Abnormal; Notable for the following:    Glucose, Bld 136 (*)    All other components within normal limits  PREGNANCY, URINE  LIPASE, BLOOD    Imaging Review [place] Abdomen Complete  10/25/2013   CLINICAL DATA:  Epigastric abdominal pain for the past day. Nausea and vomiting.  EXAM: ULTRASOUND ABDOMEN COMPLETE  COMPARISON:  No priors.  FINDINGS: Gallbladder:  No gallstones or wall thickening visualized. No sonographic Murphy sign noted.  Common bile duct:  Diameter: 3.1 mm in the porta hepatis.  Liver:  No focal lesion identified. Within normal limits in parenchymal echogenicity.  IVC:  No abnormality visualized.  Pancreas:  Visualized portion unremarkable.  Spleen:  Size and appearance within normal limits.  8.8 cm in length.  Right Kidney:  Length: 10.9 cm. Echogenicity within normal limits. No mass or hydronephrosis visualized.  Left Kidney:  Length: 10.6 cm.  Echogenicity within normal limits. No mass or hydronephrosis visualized.  Abdominal aorta:  No aneurysm visualized. Measures up to 1.8 cm in diameter proximally, and tapers appropriately distally.  Other findings:  None.  IMPRESSION: No acute findings in the abdomen to account for the patient's symptoms.   Electronically Signed   By: Trudie Reedaniel  Entrikin M.D.   On: 10/25/2013 00:51     EKG Interpretation None      MDM   Final diagnoses:  Headache  Abdominal pain    I doubt any other EMC precluding discharge at this time including, but not necessarily limited to the following:SAH, CVA, SBI, peritonitis.  I personally performed the services described in this documentation, which was scribed in my presence. The recorded information has been reviewed and is accurate.    Michele HornJohn M Lafaye Mcelmurry, MD 10/26/13 [id number]

## 2013-10-25 NOTE — Discharge Instructions (Signed)
You are having a headache. No specific cause was found today for your headache. It may have been a migraine or other cause of headache. Stress, anxiety, fatigue, and depression are common triggers for headaches. Your headache today does not appear to be life-threatening or require hospitalization, but often the exact cause of headaches is not determined in the emergency department. Therefore, follow-up with your doctor is very important to find out what may have caused your headache, and whether or not you need any further diagnostic testing or treatment. Sometimes headaches can appear benign (not harmful), but then more serious symptoms can develop which should prompt an immediate re-evaluation by your doctor or the emergency department. °SEEK MEDICAL ATTENTION IF: °You develop possible problems with medications prescribed.  °The medications don't resolve your headache, if it recurs , or if you have multiple episodes of vomiting or can't take fluids. °You have a change from the usual headache. °RETURN IMMEDIATELY IF you develop a sudden, severe headache or confusion, become poorly responsive or faint, develop a fever above 100.4F or problem breathing, have a change in speech, vision, swallowing, or understanding, or develop new weakness, numbness, tingling, incoordination, or have a seizure. ° °Abdominal (belly) pain can be caused by many things. Your caregiver performed an examination and possibly ordered blood/urine tests and imaging (CT scan, x-rays, ultrasound). Many cases can be observed and treated at home after initial evaluation in the emergency department. Even though you are being discharged home, abdominal pain can be unpredictable. Therefore, you need a repeated exam if your pain does not resolve, returns, or worsens. Most patients with abdominal pain don't have to be admitted to the hospital or have surgery, but serious problems like appendicitis and gallbladder attacks can start out as nonspecific  pain. Many abdominal conditions cannot be diagnosed in one visit, so follow-up evaluations are very important. °SEEK IMMEDIATE MEDICAL ATTENTION IF: °The pain does not go away or becomes severe.  °A temperature above 101 develops.  °Repeated vomiting occurs (multiple episodes).  °The pain becomes localized to portions of the abdomen. The right side could possibly be appendicitis. In an adult, the left lower portion of the abdomen could be colitis or diverticulitis.  °Blood is being passed in stools or vomit (bright red or black tarry stools).  °Return also if you develop chest pain, difficulty breathing, dizziness or fainting, or become confused, poorly responsive, or inconsolable (young children). ° °

## 2014-04-11 ENCOUNTER — Encounter (HOSPITAL_BASED_OUTPATIENT_CLINIC_OR_DEPARTMENT_OTHER): Payer: Self-pay | Admitting: Emergency Medicine

## 2014-04-19 ENCOUNTER — Emergency Department (HOSPITAL_BASED_OUTPATIENT_CLINIC_OR_DEPARTMENT_OTHER)
Admission: EM | Admit: 2014-04-19 | Discharge: 2014-04-19 | Disposition: A | Discharge status: Home / Self Care | Payer: Managed Care, Other (non HMO) | Attending: Emergency Medicine | Admitting: Emergency Medicine

## 2014-04-19 ENCOUNTER — Encounter (HOSPITAL_BASED_OUTPATIENT_CLINIC_OR_DEPARTMENT_OTHER): Payer: Self-pay | Admitting: *Deleted

## 2014-04-19 ENCOUNTER — Emergency Department (HOSPITAL_BASED_OUTPATIENT_CLINIC_OR_DEPARTMENT_OTHER): Payer: Managed Care, Other (non HMO)

## 2014-04-19 DIAGNOSIS — O9989 Other specified diseases and conditions complicating pregnancy, childbirth and the puerperium: Secondary | ICD-10-CM | POA: Diagnosis not present

## 2014-04-19 DIAGNOSIS — R102 Pelvic and perineal pain: Secondary | ICD-10-CM | POA: Insufficient documentation

## 2014-04-19 DIAGNOSIS — O26899 Other specified pregnancy related conditions, unspecified trimester: Secondary | ICD-10-CM

## 2014-04-19 DIAGNOSIS — O209 Hemorrhage in early pregnancy, unspecified: Secondary | ICD-10-CM | POA: Diagnosis present

## 2014-04-19 DIAGNOSIS — O10019 Pre-existing essential hypertension complicating pregnancy, unspecified trimester: Secondary | ICD-10-CM | POA: Insufficient documentation

## 2014-04-19 DIAGNOSIS — Z3A00 Weeks of gestation of pregnancy not specified: Secondary | ICD-10-CM | POA: Insufficient documentation

## 2014-04-19 DIAGNOSIS — O200 Threatened abortion: Secondary | ICD-10-CM | POA: Diagnosis not present

## 2014-04-19 LAB — URINALYSIS, ROUTINE W REFLEX MICROSCOPIC
BILIRUBIN URINE: NEGATIVE
Glucose, UA: NEGATIVE mg/dL
Hgb urine dipstick: NEGATIVE
KETONES UR: NEGATIVE mg/dL
Leukocytes, UA: NEGATIVE
NITRITE: NEGATIVE
PROTEIN: NEGATIVE mg/dL
SPECIFIC GRAVITY, URINE: 1.026 (ref 1.005–1.030)
UROBILINOGEN UA: 1 mg/dL (ref 0.0–1.0)
pH: 6.5 (ref 5.0–8.0)

## 2014-04-19 LAB — WET PREP, GENITAL
TRICH WET PREP: NONE SEEN
YEAST WET PREP: NONE SEEN

## 2014-04-19 LAB — HCG, QUANTITATIVE, PREGNANCY: hCG, Beta Chain, Quant, S: 778 m[IU]/mL — ABNORMAL HIGH (ref <5)

## 2014-04-19 LAB — HIV ANTIBODY (ROUTINE TESTING W REFLEX): HIV 1&2 Ab, 4th Generation: NONREACTIVE

## 2014-04-19 LAB — ABO/RH: ABO/RH(D): O POS

## 2014-04-19 LAB — PREGNANCY, URINE: Preg Test, Ur: POSITIVE — AB

## 2014-04-19 MED ORDER — ACETAMINOPHEN 325 MG PO TABS
650.0000 mg | ORAL_TABLET | Freq: Once | ORAL | Status: AC
  Administered 2014-04-19: 650 mg via ORAL
  Filled 2014-04-19: qty 2

## 2014-04-19 NOTE — ED Notes (Signed)
Pt reports vaginal bleeding "spotting" x 1 week, lighter than normal menstrual cycle- + home preg test 3 days ago-

## 2014-04-19 NOTE — ED Provider Notes (Signed)
CSN: [id number]     Arrival date & time 04/19/14  4098 History   First MD Initiated Contact with Patient 04/19/14 [phone]     Chief Complaint  Patient presents with  . Vaginal Bleeding     (Consider location/radiation/quality/duration/timing/severity/associated sxs/prior Treatment) Patient is a 23 y.o. female presenting with vaginal bleeding. The history is provided by the patient.  Vaginal Bleeding Quality:  Dark red Severity:  Mild Onset quality:  Gradual Duration:  1 week Timing:  Intermittent Progression:  Unchanged Chronicity:  New Possible pregnancy: yes   Context: spontaneously   Relieved by:  Nothing Worsened by:  Nothing tried Ineffective treatments:  None tried Associated symptoms: abdominal pain   Associated symptoms: no back pain, no dizziness, no dysuria, no fatigue, no fever and no nausea     Past Medical History  Diagnosis Date  . Abnormal Pap smear     repeat was normal  . Hypertension     hx of in 9th, 10th grade; states lifestyle changes helped; was never put on med because "I was too young".   Past Surgical History  Procedure Laterality Date  . Hernia repair    . Tonsillectomy    . Adenoidectomy     No family history on file. History  Substance Use Topics  . Smoking status: Never Smoker   . Smokeless tobacco: Never Used  . Alcohol Use: No   OB History    Gravida Para Term Preterm AB TAB SAB Ectopic Multiple Living   3 3 2  0 0 0 0 0 0 2     Review of Systems  Constitutional: Negative for fever and fatigue.  HENT: Negative for congestion and drooling.   Eyes: Negative for pain.  Respiratory: Negative for cough and shortness of breath.   Cardiovascular: Negative for chest pain.  Gastrointestinal: Positive for abdominal pain. Negative for nausea, vomiting and diarrhea.  Genitourinary: Positive for vaginal bleeding and pelvic pain. Negative for dysuria and hematuria.  Musculoskeletal: Negative for back pain, gait problem and neck pain.  Skin:  Negative for color change.  Neurological: Negative for dizziness and headaches.  Hematological: Negative for adenopathy.  Psychiatric/Behavioral: Negative for behavioral problems.  All other systems reviewed and are negative.     Allergies  Review of patient's allergies indicates no known allergies.  Home Medications   Prior to Admission medications   Medication Sig Start Date End Date Taking? Authorizing Provider  Prenatal Vit-Fe Fumarate-FA (PRENATAL MULTIVITAMIN) TABS tablet Take 1 tablet by mouth daily at 12 noon.    Historical Provider, MD   BP 113/77 mmHg  Pulse 82  Temp(Src) 98.5 F (36.9 C) (Oral)  Resp 18  SpO2 100%  LMP 03/10/2014 Physical Exam  Constitutional: She is oriented to person, place, and time. She appears well-developed and well-nourished.  HENT:  Head: Normocephalic.  Mouth/Throat: Oropharynx is clear and moist. No oropharyngeal exudate.  Eyes: Conjunctivae and EOM are normal. Pupils are equal, round, and reactive to light.  Neck: Normal range of motion. Neck supple.  Cardiovascular: Normal rate, regular rhythm, normal heart sounds and intact distal pulses.  Exam reveals no gallop and no friction rub.   No murmur heard. Pulmonary/Chest: Effort normal and breath sounds normal. No respiratory distress. She has no wheezes.  Abdominal: Soft. Bowel sounds are normal. There is tenderness (mild suprapubic ttp). There is no rebound and no guarding.  Genitourinary:  Normal appearing cervix. Os closed. No blood seen in vaginal vault. No CMT. Mild uterine, but no adnexal ttp  during bimanual.   Musculoskeletal: Normal range of motion. She exhibits no edema or tenderness.  Neurological: She is alert and oriented to person, place, and time.  Skin: Skin is warm and dry.  Psychiatric: She has a normal mood and affect. Her behavior is normal.  Nursing note and vitals reviewed.   ED Course  Procedures (including critical care time) Labs Review Labs Reviewed  WET  PREP, GENITAL - Abnormal; Notable for the following:    Clue Cells Wet Prep HPF POC FEW (*)    WBC, Wet Prep HPF POC MANY (*)    All other components within normal limits  PREGNANCY, URINE - Abnormal; Notable for the following:    Preg Test, Ur POSITIVE (*)    All other components within normal limits  HCG, QUANTITATIVE, PREGNANCY - Abnormal; Notable for the following:    hCG, Beta Chain, Quant, S 778 (*)    All other components within normal limits  GC/CHLAMYDIA PROBE AMP  URINALYSIS, ROUTINE W REFLEX MICROSCOPIC  HIV ANTIBODY (ROUTINE TESTING)  ABO/RH    Imaging Review [place] Ob Comp Less 14 Wks  04/19/2014   CLINICAL DATA:  Mid pelvic pain and vaginal spotting. Positive home pregnancy test. Beta HCG level, 778.  EXAM: OBSTETRIC <14 WK US AND TRANSVAGINAL OB US  TECHNIQUE: Both transabdominal and transvaginal ultrasound examinations were performed for complete evaluation of the gestation as well as the maternal uterus, adnexal regions, and pelvic cul-de-sac. Transvaginal technique was performed to assess early pregnancy.  COMPARISON:  None.  FINDINGS: Intrauterine gestational sac: There is elongated cystic structure within the left upper endometrium measuring 6.4 mm x 1.8 x 5.4 mm. This could reflect an early gestational sac.  Yolk sac:  No  Embryo:  No  Cardiac Activity: No  Maternal uterus/adnexae: No uterine masses. No endometrial fluid or endometrial mass. Cervix is closed.  Ovaries are normal in size. 11 mm hypoechoic area arises from the right ovary likely corpus luteum. No adnexal masses. Trace pelvic free fluid.  IMPRESSION: 1. Elongated cystic structure in the upper endometrium may reflect an early gestational sac. However, no yolk sac or embryo seen. Probable early intrauterine gestational sac, but no yolk sac, fetal pole, or cardiac activity yet visualized. Recommend follow-up quantitative B-HCG levels and follow-up US in 14 days to confirm and assess viability. This recommendation  follows SRU consensus guidelines: Diagnostic Criteria for Nonviable Pregnancy Early in the First Trimester. Malva Limes Engl J Med 2013; 132:4401-02; 369:1443-51. 2. No uterine abnormality.  Normal ovaries and adnexa.   Electronically Signed   By: Amie Portlandavid  Ormond M.D.   On: 04/19/2014 12:19   [place] Ob Transvaginal  04/19/2014   CLINICAL DATA:  Mid pelvic pain and vaginal spotting. Positive home pregnancy test. Beta HCG level, 778.  EXAM: OBSTETRIC <14 WK US AND TRANSVAGINAL OB US  TECHNIQUE: Both transabdominal and transvaginal ultrasound examinations were performed for complete evaluation of the gestation as well as the maternal uterus, adnexal regions, and pelvic cul-de-sac. Transvaginal technique was performed to assess early pregnancy.  COMPARISON:  None.  FINDINGS: Intrauterine gestational sac: There is elongated cystic structure within the left upper endometrium measuring 6.4 mm x 1.8 x 5.4 mm. This could reflect an early gestational sac.  Yolk sac:  No  Embryo:  No  Cardiac Activity: No  Maternal uterus/adnexae: No uterine masses. No endometrial fluid or endometrial mass. Cervix is closed.  Ovaries are normal in size. 11 mm hypoechoic area arises from the right ovary likely corpus luteum. No adnexal  masses. Trace pelvic free fluid.  IMPRESSION: 1. Elongated cystic structure in the upper endometrium may reflect an early gestational sac. However, no yolk sac or embryo seen. Probable early intrauterine gestational sac, but no yolk sac, fetal pole, or cardiac activity yet visualized. Recommend follow-up quantitative B-HCG levels and follow-up US in 14 days to confirm and assess viability. This recommendation follows SRU consensus guidelines: Diagnostic Criteria for Nonviable Pregnancy Early in the First Trimester. Malva Limes Engl J Med 2013; 161:0960-45; 369:1443-51. 2. No uterine abnormality.  Normal ovaries and adnexa.   Electronically Signed   By: Amie Portlandavid  Ormond M.D.   On: 04/19/2014 12:19     EKG Interpretation None      MDM   Final diagnoses:   Pelvic pain affecting pregnancy  Threatened abortion    10:36 AM 23 y.o. female G3P2 at approx 5 weeks (per LMP Oct 1-3) who presents with intermittent pelvic cramping and vaginal spotting over the last week. She denies any fevers, vomiting, or diarrhea. She has had some mild nausea. She notes that her pain does not lateralize and seems to be in the suprapubic area. She denies any history of STDs. She has 2 children. We'll get screening lab work and perform pelvic exam.she states that she took a pregnancy test several days ago and it was positive. No hx of ectopic. Lower suspicion for ectopic as her pain does not lateralize, but will plan on ruling this out.   Rh pos. US shows cystic structure. I recommended pt f/u w/ her obgyn for repeat hcg in 2 days and repeat US in 1-2 weeks.   12:57 PM:  I have discussed the diagnosis/risks/treatment options with the patient and believe the pt to be eligible for discharge home to follow-up with her obgyn in 2 days. We also discussed returning to the ED immediately if new or worsening sx occur. We discussed the sx which are most concerning (e.g., given ectopic precautions for syncope, worsening pain, or worsening vaginal bleeding) that necessitate immediate return. Medications administered to the patient during their visit and any new prescriptions provided to the patient are listed below.  Medications given during this visit Medications  acetaminophen (TYLENOL) tablet 650 mg (650 mg Oral Given 04/19/14 1054)    New Prescriptions   No medications on file     Purvis SheffieldForrest Wilver Tignor, MD 04/19/14 2134

## 2014-04-19 NOTE — ED Notes (Signed)
No RX given - follow care discussed

## 2014-04-19 NOTE — ED Notes (Signed)
Patient in Ultrasound.

## 2014-04-20 LAB — GC/CHLAMYDIA PROBE AMP
CT Probe RNA: NEGATIVE
GC Probe RNA: NEGATIVE

## 2014-04-21 ENCOUNTER — Encounter (HOSPITAL_COMMUNITY): Payer: Self-pay | Admitting: *Deleted

## 2014-04-21 ENCOUNTER — Inpatient Hospital Stay (HOSPITAL_COMMUNITY): Payer: Managed Care, Other (non HMO)

## 2014-04-21 ENCOUNTER — Inpatient Hospital Stay (HOSPITAL_COMMUNITY)
Admission: AD | Admit: 2014-04-21 | Discharge: 2014-04-21 | Disposition: A | Discharge status: Home / Self Care | Payer: Managed Care, Other (non HMO) | Source: Ambulatory Visit | Attending: Obstetrics and Gynecology | Admitting: Obstetrics and Gynecology

## 2014-04-21 DIAGNOSIS — O209 Hemorrhage in early pregnancy, unspecified: Secondary | ICD-10-CM | POA: Diagnosis not present

## 2014-04-21 DIAGNOSIS — Z3A01 Less than 8 weeks gestation of pregnancy: Secondary | ICD-10-CM | POA: Insufficient documentation

## 2014-04-21 DIAGNOSIS — O200 Threatened abortion: Secondary | ICD-10-CM | POA: Diagnosis not present

## 2014-04-21 LAB — CBC
HEMATOCRIT: 33.6 % — AB (ref 36.0–46.0)
Hemoglobin: 11.5 g/dL — ABNORMAL LOW (ref 12.0–15.0)
MCH: 32.1 pg (ref 26.0–34.0)
MCHC: 34.2 g/dL (ref 30.0–36.0)
MCV: 93.9 fL (ref 78.0–100.0)
Platelets: 233 10*3/uL (ref 150–400)
RBC: 3.58 MIL/uL — AB (ref 3.87–5.11)
RDW: 12.2 % (ref 11.5–15.5)
WBC: 7 10*3/uL (ref 4.0–10.5)

## 2014-04-21 LAB — URINE MICROSCOPIC-ADD ON

## 2014-04-21 LAB — URINALYSIS, ROUTINE W REFLEX MICROSCOPIC
Bilirubin Urine: NEGATIVE
GLUCOSE, UA: NEGATIVE mg/dL
KETONES UR: NEGATIVE mg/dL
LEUKOCYTES UA: NEGATIVE
Nitrite: NEGATIVE
PROTEIN: NEGATIVE mg/dL
Specific Gravity, Urine: 1.02 (ref 1.005–1.030)
UROBILINOGEN UA: 0.2 mg/dL (ref 0.0–1.0)
pH: 8 (ref 5.0–8.0)

## 2014-04-21 LAB — POCT PREGNANCY, URINE: Preg Test, Ur: POSITIVE — AB

## 2014-04-21 LAB — HCG, QUANTITATIVE, PREGNANCY: hCG, Beta Chain, Quant, S: 399 m[IU]/mL — ABNORMAL HIGH (ref <5)

## 2014-04-21 MED ORDER — ACETAMINOPHEN 325 MG PO TABS: 650.0000 mg | ORAL_TABLET | ORAL | Status: DC

## 2014-04-21 NOTE — Discharge Instructions (Signed)

## 2014-04-21 NOTE — MAU Note (Signed)
C/o Left lower cramping in abdomen and back for past 2-3 days; c/o heavy bleeding that started this AM; concerned that she might have an ectopic pregnancy; positive UPT in MAU today;

## 2014-04-21 NOTE — MAU Provider Note (Signed)
History     CSN: [id number]  Arrival date and time: 04/21/14 [id number]   None     Chief Complaint  Patient presents with  . Vaginal Bleeding   HPI Michele [name] 23 y.o. [id number] @[redacted]w[redacted]d  presents to MAU complaining of abdominal pain and vaginal bleeding.  She awoke with large amount of dark red vaginal bleeding with clots this am.  She has soaked 2 pads in the last 3.5 hours.   Her pain has been worse since last last night and is 8/10 described as crampy and sharp.  She is having nausea.  She denies fever, dizziness, weakness, headache.   She went to [facility] for pain.  She had ultrasound that gave little info.  Tylenol helped with the pain.  She has new ob appt next Tuesday.   OB History    Gravida Para Term Preterm AB TAB SAB Ectopic Multiple Living   4 3 2  0 0 0 0 0 0 2      Past Medical History  Diagnosis Date  . Abnormal Pap smear     repeat was normal  . Hypertension     hx of in 9th, 10th grade; states lifestyle changes helped; was never put on med because "I was too young".    Past Surgical History  Procedure Laterality Date  . Hernia repair    . Tonsillectomy    . Adenoidectomy      History reviewed. No pertinent family history.  History  Substance Use Topics  . Smoking status: Never Smoker   . Smokeless tobacco: Never Used  . Alcohol Use: No    Allergies: No Known Allergies  Prescriptions prior to admission  Medication Sig Dispense Refill Last Dose  . Prenatal Vit-Fe Fumarate-FA (PRENATAL MULTIVITAMIN) TABS tablet Take 1 tablet by mouth daily at 12 noon.   Past Week at Unknown time    ROS pertinent ros in hpi Physical Exam   Blood pressure 117/73, pulse 75, temperature 98.1 F (36.7 C), temperature source Oral, resp. rate 18, height 4\' 11"  (1.499 m), weight 132 lb (59.875 kg), last menstrual period 03/10/2014, unknown if currently breastfeeding.  Physical Exam  Constitutional: She is oriented to person, place, and time. She appears  well-developed and well-nourished.  HENT:  Head: Normocephalic and atraumatic.  Eyes: EOM are normal.  Neck: Normal range of motion.  Cardiovascular: Normal rate, regular rhythm and normal heart sounds.   Respiratory: Effort normal and breath sounds normal. No respiratory distress.  GI: Soft. Bowel sounds are normal. She exhibits no distension. There is no tenderness. There is no rebound and no guarding.  Genitourinary:  Moderate amt of dark red blood in vaginal vault.  Small Tissue present in cervical os (?POC).   No CMT.  Cervix is closed.  No adnexal tenderness or mass.    Musculoskeletal: Normal range of motion.  Neurological: She is alert and oriented to person, place, and time.  Skin: Skin is warm and dry.  Psychiatric: She has a normal mood and affect.   Results for orders placed or performed during the hospital encounter of 04/21/14 (from the past 24 hour(s))  Urinalysis, Routine w reflex microscopic     Status: Abnormal   Collection Time: 04/21/14  8:53 AM  Result Value Ref Range   Color, Urine YELLOW YELLOW   APPearance CLEAR CLEAR   Specific Gravity, Urine 1.020 1.005 - 1.030   pH 8.0 5.0 - 8.0   Glucose, UA NEGATIVE NEGATIVE mg/dL  Hgb urine dipstick LARGE (A) NEGATIVE   Bilirubin Urine NEGATIVE NEGATIVE   Ketones, ur NEGATIVE NEGATIVE mg/dL   Protein, ur NEGATIVE NEGATIVE mg/dL   Urobilinogen, UA 0.2 0.0 - 1.0 mg/dL   Nitrite NEGATIVE NEGATIVE   Leukocytes, UA NEGATIVE NEGATIVE  Urine microscopic-add on     Status: None   Collection Time: 04/21/14  8:53 AM  Result Value Ref Range   Squamous Epithelial / LPF RARE RARE   RBC / HPF 21-50 <3 RBC/hpf  Pregnancy, urine POC     Status: Abnormal   Collection Time: 04/21/14  8:57 AM  Result Value Ref Range   Preg Test, Ur POSITIVE (A) NEGATIVE  CBC     Status: Abnormal   Collection Time: 04/21/14  9:52 AM  Result Value Ref Range   WBC 7.0 4.0 - 10.5 K/uL   RBC 3.58 (L) 3.87 - 5.11 MIL/uL   Hemoglobin 11.5 (L) 12.0 -  15.0 g/dL   HCT 86.533.6 (L) 78.436.0 - 69.646.0 %   MCV 93.9 78.0 - 100.0 fL   MCH 32.1 26.0 - 34.0 pg   MCHC 34.2 30.0 - 36.0 g/dL   RDW 29.512.2 28.411.5 - 13.215.5 %   Platelets 233 150 - 400 K/uL  hCG, quantitative, pregnancy     Status: Abnormal   Collection Time: 04/21/14  9:52 AM  Result Value Ref Range   hCG, Beta Chain, Quant, S 399 (H) <5 mIU/mL   [place] Ob Transvaginal  04/21/2014   CLINICAL DATA:  Vaginal bleeding in pregnancy, first trimester. Left lower quadrant and back pain. Decreasing beta HCG level.  EXAM: TRANSVAGINAL OB ULTRASOUND  TECHNIQUE: Transvaginal ultrasound was performed for complete evaluation of the gestation as well as the maternal uterus, adnexal regions, and pelvic cul-de-sac.  COMPARISON:  04/19/2014  FINDINGS: Intrauterine gestational sac: None visualized. 6 mm cystic structure in the upper endometrium on the prior ultrasound is no longer seen.  Maternal uterus/adnexae: Uterus, endometrium, and ovaries are unremarkable in appearance. Small amount of free fluid is noted in the pelvis.  IMPRESSION: No intrauterine gestation identified. Small cystic structure in the endometrium on the prior study is no longer seen. Small amount of pelvic free fluid without ectopic pregnancy identified.   Electronically Signed   By: Sebastian AcheAllen  Grady   On: 04/21/2014 11:40    MAU Course  Procedures  MDM Discussed with Dr. Henderson CloudHorvath.  Likely miscarriage in process.  Dr. Henderson CloudHorvath agrees with discharge and would like pt to have HCG in 48 hours and then seen in office early next week.    Assessment and Plan  A:  1. Threatened miscarriage in early pregnancy   2. Vaginal bleeding in pregnancy, first trimester    P: Discharge to home OTC Tylenol for discomfort Return to MAU in 48 hours for repeat quant to be sure level is dropping and no worsening of sxs.   Follow up in office early next week Return to MAU for emergency/severe or heavy bleeding/ severe pain/fever/etc.    Glyn Adeeague Clark, Scot JunKaren E 04/21/2014,  9:39 AM

## 2014-07-08 ENCOUNTER — Encounter (HOSPITAL_BASED_OUTPATIENT_CLINIC_OR_DEPARTMENT_OTHER): Payer: Self-pay | Admitting: *Deleted

## 2014-07-08 ENCOUNTER — Emergency Department (HOSPITAL_BASED_OUTPATIENT_CLINIC_OR_DEPARTMENT_OTHER)
Admission: EM | Admit: 2014-07-08 | Discharge: 2014-07-08 | Disposition: A | Discharge status: Home / Self Care | Payer: Managed Care, Other (non HMO) | Attending: Emergency Medicine | Admitting: Emergency Medicine

## 2014-07-08 DIAGNOSIS — R1110 Vomiting, unspecified: Secondary | ICD-10-CM

## 2014-07-08 DIAGNOSIS — K529 Noninfective gastroenteritis and colitis, unspecified: Secondary | ICD-10-CM | POA: Diagnosis not present

## 2014-07-08 DIAGNOSIS — R197 Diarrhea, unspecified: Secondary | ICD-10-CM

## 2014-07-08 DIAGNOSIS — Z3202 Encounter for pregnancy test, result negative: Secondary | ICD-10-CM | POA: Diagnosis not present

## 2014-07-08 DIAGNOSIS — R1084 Generalized abdominal pain: Secondary | ICD-10-CM | POA: Diagnosis present

## 2014-07-08 DIAGNOSIS — I10 Essential (primary) hypertension: Secondary | ICD-10-CM | POA: Diagnosis not present

## 2014-07-08 DIAGNOSIS — Z9049 Acquired absence of other specified parts of digestive tract: Secondary | ICD-10-CM | POA: Insufficient documentation

## 2014-07-08 DIAGNOSIS — Z79899 Other long term (current) drug therapy: Secondary | ICD-10-CM | POA: Diagnosis not present

## 2014-07-08 LAB — COMPREHENSIVE METABOLIC PANEL
ALBUMIN: 4.4 g/dL (ref 3.5–5.2)
ALT: 12 U/L (ref 0–35)
AST: 18 U/L (ref 0–37)
Alkaline Phosphatase: 83 U/L (ref 39–117)
Anion gap: 4 — ABNORMAL LOW (ref 5–15)
BILIRUBIN TOTAL: 0.8 mg/dL (ref 0.3–1.2)
BUN: 15 mg/dL (ref 6–23)
CALCIUM: 8.6 mg/dL (ref 8.4–10.5)
CO2: 23 mmol/L (ref 19–32)
Chloride: 110 mmol/L (ref 96–112)
Creatinine, Ser: 0.69 mg/dL (ref 0.50–1.10)
Glucose, Bld: 93 mg/dL (ref 70–99)
POTASSIUM: 3.7 mmol/L (ref 3.5–5.1)
Sodium: 137 mmol/L (ref 135–145)
TOTAL PROTEIN: 7.4 g/dL (ref 6.0–8.3)

## 2014-07-08 LAB — CBC WITH DIFFERENTIAL/PLATELET
Basophils Absolute: 0 10*3/uL (ref 0.0–0.1)
Basophils Relative: 0 % (ref 0–1)
EOS ABS: 0 10*3/uL (ref 0.0–0.7)
Eosinophils Relative: 0 % (ref 0–5)
HCT: 37.5 % (ref 36.0–46.0)
Hemoglobin: 12.3 g/dL (ref 12.0–15.0)
LYMPHS PCT: 9 % — AB (ref 12–46)
Lymphs Abs: 0.6 10*3/uL — ABNORMAL LOW (ref 0.7–4.0)
MCH: 31.1 pg (ref 26.0–34.0)
MCHC: 32.8 g/dL (ref 30.0–36.0)
MCV: 94.9 fL (ref 78.0–100.0)
MONO ABS: 0.5 10*3/uL (ref 0.1–1.0)
MONOS PCT: 7 % (ref 3–12)
NEUTROS PCT: 84 % — AB (ref 43–77)
Neutro Abs: 6.2 10*3/uL (ref 1.7–7.7)
PLATELETS: 247 10*3/uL (ref 150–400)
RBC: 3.95 MIL/uL (ref 3.87–5.11)
RDW: 11.6 % (ref 11.5–15.5)
WBC: 7.4 10*3/uL (ref 4.0–10.5)

## 2014-07-08 LAB — URINALYSIS, ROUTINE W REFLEX MICROSCOPIC
BILIRUBIN URINE: NEGATIVE
Glucose, UA: NEGATIVE mg/dL
HGB URINE DIPSTICK: NEGATIVE
Ketones, ur: 15 mg/dL — AB
LEUKOCYTES UA: NEGATIVE
Nitrite: NEGATIVE
Protein, ur: NEGATIVE mg/dL
SPECIFIC GRAVITY, URINE: 1.03 (ref 1.005–1.030)
Urobilinogen, UA: 1 mg/dL (ref 0.0–1.0)
pH: 6 (ref 5.0–8.0)

## 2014-07-08 LAB — PREGNANCY, URINE: Preg Test, Ur: NEGATIVE

## 2014-07-08 LAB — LIPASE, BLOOD: Lipase: 30 U/L (ref 11–59)

## 2014-07-08 MED ORDER — SODIUM CHLORIDE 0.9 % IV BOLUS (SEPSIS)
1000.0000 mL | Freq: Once | INTRAVENOUS | Status: AC
  Administered 2014-07-08: 1000 mL via INTRAVENOUS

## 2014-07-08 MED ORDER — ONDANSETRON HCL 4 MG/2ML IJ SOLN
4.0000 mg | Freq: Once | INTRAMUSCULAR | Status: AC
  Administered 2014-07-08: 4 mg via INTRAVENOUS
  Filled 2014-07-08: qty 2

## 2014-07-08 MED ORDER — ONDANSETRON 4 MG PO TBDP: ORAL_TABLET | ORAL | Status: DC

## 2014-07-08 NOTE — ED Notes (Signed)
MD at bedside. 

## 2014-07-08 NOTE — ED Notes (Signed)
Patient states she developed mid abdominal pain around 2300.  States pain was followed by vomiting and then diarrhea.  Has vomited x 6 and had diarrhea x 3.

## 2014-07-08 NOTE — ED Provider Notes (Signed)
CSN: [id number]     Arrival date & time 07/08/14  8657 History   First MD Initiated Contact with Patient 07/08/14 [phone]     Chief Complaint  Patient presents with  . Abdominal Pain     (Consider location/radiation/quality/duration/timing/severity/associated sxs/prior Treatment) Patient is a 24 y.o. female presenting with abdominal pain.  Abdominal Pain Pain location:  Generalized Pain quality: aching and cramping   Pain radiates to:  Does not radiate Pain severity:  Moderate Onset quality:  Gradual Duration:  1 day Timing:  Constant Progression:  Unchanged Chronicity:  New Context: recent illness (URI) and sick contacts   Context comment:  Miscarriage 1 month ago Relieved by:  Nothing Worsened by:  Nothing tried Associated symptoms: chills, diarrhea, nausea and vomiting   Associated symptoms: no anorexia, no constipation, no dysuria, no fever, no hematochezia, no hematuria, no vaginal bleeding and no vaginal discharge     Past Medical History  Diagnosis Date  . Abnormal Pap smear     repeat was normal  . Hypertension     hx of in 9th, 10th grade; states lifestyle changes helped; was never put on med because "I was too young".   Past Surgical History  Procedure Laterality Date  . Hernia repair    . Tonsillectomy    . Adenoidectomy     No family history on file. History  Substance Use Topics  . Smoking status: Never Smoker   . Smokeless tobacco: Never Used  . Alcohol Use: No   OB History    Gravida Para Term Preterm AB TAB SAB Ectopic Multiple Living   0 0 0 0 0 0 2     Review of Systems  Constitutional: Positive for chills. Negative for fever.  Gastrointestinal: Positive for nausea, vomiting, abdominal pain and diarrhea. Negative for constipation, hematochezia and anorexia.  Genitourinary: Negative for dysuria, hematuria, vaginal bleeding and vaginal discharge.  All other systems reviewed and are negative.     Allergies  Review of patient's  allergies indicates no known allergies.  Home Medications   Prior to Admission medications   Medication Sig Start Date End Date Taking? Authorizing Provider  ondansetron (ZOFRAN ODT) 4 MG disintegrating tablet  ODT q4 hours prn nausea/vomit 07/08/14   Mirian Mo, MD  Prenatal Vit-Fe Fumarate-FA (PRENATAL MULTIVITAMIN) TABS tablet Take 1 tablet by mouth daily at 12 noon.    Historical Provider, MD   BP 117/81 mmHg  Pulse 88  Temp(Src) 98.2 F (36.8 C) (Oral)  Resp 18  Ht  (1.499 m)  Wt 131 lb (59.421 kg)  BMI 26.44 kg/m2  SpO2 96%  LMP 06/14/2014 (Exact Date)  Breastfeeding? Unknown Physical Exam  Constitutional: She is oriented to person, place, and time. She appears well-developed and well-nourished.  HENT:  Head: Normocephalic and atraumatic.  Right Ear: External ear normal.  Left Ear: External ear normal.  Eyes: Conjunctivae and EOM are normal. Pupils are equal, round, and reactive to light.  Neck: Normal range of motion. Neck supple.  Cardiovascular: Normal rate, regular rhythm, normal heart sounds and intact distal pulses.   Pulmonary/Chest: Effort normal and breath sounds normal.  Abdominal: Soft. Bowel sounds are normal. There is tenderness in the right upper quadrant, epigastric area and suprapubic area.  Musculoskeletal: Normal range of motion.  Neurological: She is alert and oriented to person, place, and time.  Skin: Skin is warm and dry.  Vitals reviewed.   ED Course  Procedures (including critical care time) Labs Review  Labs Reviewed  URINALYSIS, ROUTINE W REFLEX MICROSCOPIC - Abnormal; Notable for the following:    Ketones, ur 15 (*)    All other components within normal limits  COMPREHENSIVE METABOLIC PANEL - Abnormal; Notable for the following:    Anion gap 4 (*)    All other components within normal limits  CBC WITH DIFFERENTIAL/PLATELET - Abnormal; Notable for the following:    Neutrophils Relative % 84 (*)    Lymphocytes Relative 9 (*)     Lymphs Abs 0.6 (*)    All other components within normal limits  PREGNANCY, URINE  LIPASE, BLOOD    Imaging Review No results found.   EKG Interpretation None      MDM   Final diagnoses:  Generalized abdominal pain  Vomiting and diarrhea  Gastroenteritis    24 y.o. female with pertinent PMH of recent miscarriage presents with abd pain as above.  Exam as above.  Pt has mild tenderness of epigastrium, superpubic area, right upper quadrant. No Murphy sign. No right lower quadrant pain. No vaginal bleeding or discharge reported and no adnexal pain. Pain is constant. Also described as mild. Worsened with bowel movements and vomiting. Checked LFTs and lipase, in combination with low historical suspicion, consider biliary colic, cholecystitis, pancreatitis unlikely. Also consider ovarian torsion, TOA, PID unlikely given location and character pain. Patient was given standard return precautions, voiced understanding and will follow-up. Discharged home in stable condition.  I have reviewed all laboratory and imaging studies if ordered as above  1. Generalized abdominal pain   2. Vomiting and diarrhea   3. Gastroenteritis         Mirian MoMatthew Deniro Laymon, MD 07/08/14 [phone]

## 2014-07-08 NOTE — Discharge Instructions (Signed)
Abdominal Pain, Women °Abdominal (stomach, pelvic, or belly) pain can be caused by many things. It is important to tell your doctor: °· The location of the pain. °· Does it come and go or is it present all the time? °· Are there things that start the pain (eating certain foods, exercise)? °· Are there other symptoms associated with the pain (fever, nausea, vomiting, diarrhea)? °All of this is helpful to know when trying to find the cause of the pain. °CAUSES  °· Stomach: virus or bacteria infection, or ulcer. °· Intestine: appendicitis (inflamed appendix), regional ileitis (Crohn's disease), ulcerative colitis (inflamed colon), irritable bowel syndrome, diverticulitis (inflamed diverticulum of the colon), or cancer of the stomach or intestine. °· Gallbladder disease or stones in the gallbladder. °· Kidney disease, kidney stones, or infection. °· Pancreas infection or cancer. °· Fibromyalgia (pain disorder). °· Diseases of the female organs: °¨ Uterus: fibroid (non-cancerous) tumors or infection. °¨ Fallopian tubes: infection or tubal pregnancy. °¨ Ovary: cysts or tumors. °¨ Pelvic adhesions (scar tissue). °¨ Endometriosis (uterus lining tissue growing in the pelvis and on the pelvic organs). °¨ Pelvic congestion syndrome (female organs filling up with blood just before the menstrual period). °¨ Pain with the menstrual period. °¨ Pain with ovulation (producing an egg). °¨ Pain with an IUD (intrauterine device, birth control) in the uterus. °¨ Cancer of the female organs. °· Functional pain (pain not caused by a disease, may improve without treatment). °· Psychological pain. °· Depression. °DIAGNOSIS  °Your doctor will decide the seriousness of your pain by doing an examination. °· Blood tests. °· X-rays. °· Ultrasound. °· CT scan (computed tomography, special type of X-ray). °· MRI (magnetic resonance imaging). °· Cultures, for infection. °· Barium enema (dye inserted in the large intestine, to better view it with  X-rays). °· Colonoscopy (looking in intestine with a lighted tube). °· Laparoscopy (minor surgery, looking in abdomen with a lighted tube). °· Major abdominal exploratory surgery (looking in abdomen with a large incision). °TREATMENT  °The treatment will depend on the cause of the pain.  °· Many cases can be observed and treated at home. °· Over-the-counter medicines recommended by your caregiver. °· Prescription medicine. °· Antibiotics, for infection. °· Birth control pills, for painful periods or for ovulation pain. °· Hormone treatment, for endometriosis. °· Nerve blocking injections. °· Physical therapy. °· Antidepressants. °· Counseling with a psychologist or psychiatrist. °· Minor or major surgery. °HOME CARE INSTRUCTIONS  °· Do not take laxatives, unless directed by your caregiver. °· Take over-the-counter pain medicine only if ordered by your caregiver. Do not take aspirin because it can cause an upset stomach or bleeding. °· Try a clear liquid diet (broth or water) as ordered by your caregiver. Slowly move to a bland diet, as tolerated, if the pain is related to the stomach or intestine. °· Have a thermometer and take your temperature several times a day, and record it. °· Bed rest and sleep, if it helps the pain. °· Avoid sexual intercourse, if it causes pain. °· Avoid stressful situations. °· Keep your follow-up appointments and tests, as your caregiver orders. °· If the pain does not go away with medicine or surgery, you may try: °¨ Acupuncture. °¨ Relaxation exercises (yoga, meditation). °¨ Group therapy. °¨ Counseling. °SEEK MEDICAL CARE IF:  °· You notice certain foods cause stomach pain. °· Your home care treatment is not helping your pain. °· You need stronger pain medicine. °· You want your IUD removed. °· You feel faint or   lightheaded. °· You develop nausea and vomiting. °· You develop a rash. °· You are having side effects or an allergy to your medicine. °SEEK IMMEDIATE MEDICAL CARE IF:  °· Your  pain does not go away or gets worse. °· You have a fever. °· Your pain is felt only in portions of the abdomen. The right side could possibly be appendicitis. The left lower portion of the abdomen could be colitis or diverticulitis. °· You are passing blood in your stools (bright red or black tarry stools, with or without vomiting). °· You have blood in your urine. °· You develop chills, with or without a fever. °· You pass out. °MAKE SURE YOU:  °· Understand these instructions. °· Will watch your condition. °· Will get help right away if you are not doing well or get worse. °Document Released: 03/24/2007 Document Revised: 10/11/2013 Document Reviewed: 04/13/2009 °ExitCare® Patient Information ©2015 [employer]. This information is not intended to replace advice given to you by your health care provider. Make sure you discuss any questions you have with your health care provider. ° °

## 2014-10-26 ENCOUNTER — Encounter (HOSPITAL_BASED_OUTPATIENT_CLINIC_OR_DEPARTMENT_OTHER): Payer: Self-pay

## 2014-10-26 ENCOUNTER — Emergency Department (HOSPITAL_BASED_OUTPATIENT_CLINIC_OR_DEPARTMENT_OTHER)
Admission: EM | Admit: 2014-10-26 | Discharge: 2014-10-26 | Disposition: A | Discharge status: Home / Self Care | Payer: Managed Care, Other (non HMO) | Attending: Emergency Medicine | Admitting: Emergency Medicine

## 2014-10-26 DIAGNOSIS — Z79899 Other long term (current) drug therapy: Secondary | ICD-10-CM | POA: Insufficient documentation

## 2014-10-26 DIAGNOSIS — Z3200 Encounter for pregnancy test, result unknown: Secondary | ICD-10-CM | POA: Diagnosis present

## 2014-10-26 DIAGNOSIS — I10 Essential (primary) hypertension: Secondary | ICD-10-CM | POA: Insufficient documentation

## 2014-10-26 DIAGNOSIS — Z3202 Encounter for pregnancy test, result negative: Secondary | ICD-10-CM | POA: Insufficient documentation

## 2014-10-26 LAB — URINALYSIS, ROUTINE W REFLEX MICROSCOPIC
Bilirubin Urine: NEGATIVE
Glucose, UA: NEGATIVE mg/dL
Hgb urine dipstick: NEGATIVE
Ketones, ur: NEGATIVE mg/dL
LEUKOCYTES UA: NEGATIVE
NITRITE: NEGATIVE
PH: 6 (ref 5.0–8.0)
PROTEIN: NEGATIVE mg/dL
Specific Gravity, Urine: 1.027 (ref 1.005–1.030)
Urobilinogen, UA: 0.2 mg/dL (ref 0.0–1.0)

## 2014-10-26 LAB — PREGNANCY, URINE: Preg Test, Ur: NEGATIVE

## 2014-10-26 NOTE — ED Provider Notes (Signed)
CSN: [id number]     Arrival date & time 10/26/14  1830 History   First MD Initiated Contact with Patient 10/26/14 1841     Chief Complaint  Patient presents with  . Possible Pregnancy     (Consider location/radiation/quality/duration/timing/severity/associated sxs/prior Treatment) HPI Comments: States that she is here to see if she is pregnant. She states that she had 2 positive and one negative pregnancies.lmp was 4/15  Patient is a 24 y.o. female presenting with pregnancy problem. The history is provided by the patient. No language interpreter was used.  Possible Pregnancy This is a new problem. The current episode started today. The problem occurs constantly. The problem has been unchanged. Pertinent negatives include no abdominal pain. Nothing aggravates the symptoms. She has tried nothing for the symptoms.    Past Medical History  Diagnosis Date  . Abnormal Pap smear     repeat was normal  . Hypertension     hx of in 9th, 10th grade; states lifestyle changes helped; was never put on med because "I was too young".   Past Surgical History  Procedure Laterality Date  . Hernia repair    . Tonsillectomy    . Adenoidectomy     No family history on file. History  Substance Use Topics  . Smoking status: Never Smoker   . Smokeless tobacco: Never Used  . Alcohol Use: No   OB History    Gravida Para Term Preterm AB TAB SAB Ectopic Multiple Living   4 3 2  0 0 0 0 0 0 2     Review of Systems  Gastrointestinal: Negative for abdominal pain.  All other systems reviewed and are negative.     Allergies  Review of patient's allergies indicates no known allergies.  Home Medications   Prior to Admission medications   Medication Sig Start Date End Date Taking? Authorizing Provider  ondansetron (ZOFRAN ODT) 4 MG disintegrating tablet 4mg  ODT q4 hours prn nausea/vomit 07/08/14   Mirian MoMatthew Gentry, MD  Prenatal Vit-Fe Fumarate-FA (PRENATAL MULTIVITAMIN) TABS tablet Take 1 tablet by  mouth daily at 12 noon.    Historical Provider, MD   BP 123/86 mmHg  Pulse 80  Temp(Src) 98.6 F (37 C) (Oral)  Resp 16  Ht 4\' 11"  (1.499 m)  Wt 135 lb (61.236 kg)  BMI 27.25 kg/m2  SpO2 100%  LMP 09/22/2013 Physical Exam  Constitutional: She is oriented to person, place, and time. She appears well-developed and well-nourished.  HENT:  Head: Normocephalic and atraumatic.  Cardiovascular: Normal rate and regular rhythm.   Pulmonary/Chest: Effort normal and breath sounds normal.  Abdominal: Soft. Bowel sounds are normal. There is no tenderness.  Musculoskeletal: Normal range of motion.  Neurological: She is alert and oriented to person, place, and time.  Skin: Skin is warm and dry.  Psychiatric: She has a normal mood and affect.  Nursing note and vitals reviewed.   ED Course  Procedures (including critical care time) Labs Review Labs Reviewed  URINALYSIS, ROUTINE W REFLEX MICROSCOPIC  PREGNANCY, URINE    Imaging Review No results found.   EKG Interpretation None      MDM   Final diagnoses:  Pregnancy test negative    Pregnancy negative   Teressa LowerVrinda Antwann Preziosi, NP 10/26/14 1912  Richardean Canalavid H Yao, MD 10/26/14 2330

## 2014-10-26 NOTE — Discharge Instructions (Signed)
Pregnancy Tests °HOW DO PREGNANCY TESTS WORK? °All pregnancy tests look for a special hormone in the urine or blood that is only present in pregnant women. This hormone, human chorionic gonadotropin (hCG), is also called the pregnancy hormone.  °WHAT IS THE DIFFERENCE BETWEEN A URINE AND A BLOOD PREGNANCY TEST? IS ONE BETTER THAN THE OTHER? °There are two types of pregnancy tests. °· Blood tests. °· Urine tests. °Both tests look for the presence of hCG, the pregnancy hormone. Many women use a urine test or home pregnancy test (HPT) to find out if they are pregnant. HPTs are cheap, easy to use, can be done at home, and are private. When a woman has a positive result on an HPT, she needs to see her caregiver right away. The caregiver can confirm a positive HPT result with another urine test, a blood test, ultrasound, and a pelvic exam.  °There are two types of blood tests you can get from a caregiver.  °· A quantitative blood test (or the beta hCG test). This test measures the exact amount of hCG in the blood. This means it can pick up very small amounts of hCG, making it a very accurate test. °· A qualitative hCG blood test. This test gives a simple yes or no answer to whether you are pregnant. This test is more like a urine test in terms of its accuracy. °Blood tests can pick up hCG earlier in a pregnancy than urine tests can. Blood tests can tell if you are pregnant about 6 to 8 days after you release an egg from an ovary (ovulate). Urine tests can determine pregnancy about 2 weeks after ovulation.  °HOW IS A HOME PREGNANCY TEST DONE?  °There are many types of home pregnancy tests or HPTs that can be bought over-the-counter at drug or discount stores.  °· Some involve collecting your urine in a cup and dipping a stick into the urine or putting some of the urine into a special container with an eyedropper. °· Others are done by placing a stick into your urine stream. °· Tests vary in how long you need to wait for  the stick or container to turn a certain color or have a symbol on it (like a plus or a minus). °· All tests come with written instructions. Most tests also have toll-free phone numbers to call if you have any questions about how to do the test or read the results. °HOW ACCURATE ARE HOME PREGNANCY TESTS?  °HPTs are very accurate. Most brands of HPTs say they are 97% to 99% accurate when taken 1 week after missing your menstrual period, but this can vary with actual use. Each brand varies in how sensitive it is in picking up the pregnancy hormone hCG. If a test is not done correctly, it will be less accurate. Always check the package to make sure it is not past its expiration date. If it is, it will not be accurate. Most brands of HPTs tell users to do the test again in a few days, no matter what the results.  °If you use an HPT too early in your pregnancy, you may not have enough of the pregnancy hormone hCG in your urine to have a positive test result. Most HPTs will be accurate if you test yourself around the time your period is due (about 2 weeks after you ovulate). You can get a negative test result if you are not pregnant or if you ovulated later than you thought you did.   You may also have problems with the pregnancy, which affects the amount of hCG you have in your urine. If your HPT is negative, test yourself again within a few days to 1 week. If you keep getting a negative result and think you are pregnant, talk with your caregiver right away about getting a blood pregnancy test.  °FALSE POSITIVE PREGNANCY TEST °A false positive HPT can happen if there is blood or protein present in your urine. A false positive can also happen if you were recently pregnant or if you take a pregnancy test too soon after taking fertility drug that contains hCG. Also, some prescription medicines such as water pills (diuretics), tranquilizers, seizure medicines, psychiatric medicines, and allergy and nausea medicines  (promethazine) give false positive readings. °FALSE NEGATIVE PREGNANCY TEST °· A false negative HPT can happen if you do the test too early. Try to wait until you are at least 1 day late for your menstrual period. °· It may happen if you wait too long to test the urine (longer than 15 minutes). °· It may also happen if the urine is too diluted because you drank a lot of fluids before getting the urine sample. It is best to test the first morning urine after you get out of bed. °If your menstrual period did not start after a week of a negative HPT, repeat the pregnancy test. °CAN ANYTHING INTERFERE WITH HOME PREGNANCY TEST RESULTS?  °Most medicines, both over-the-counter and prescription drugs, including birth control pills and antibiotics, should not affect the results of a HPT. Only those drugs that have the pregnancy hormone hCG in them can give a false positive test result. Drugs that have hCG in them may be used for treating infertility (not being able to get pregnant). Alcohol and illegal drugs do not affect HPT results, but you should not be using these substances if you are trying to get pregnant. If you have a positive pregnancy test, call your caregiver to make an appointment to begin prenatal care. °Document Released: 05/30/2003 Document Revised: 08/19/2011 Document Reviewed: 09/10/2013 °Exit[employer]® Patient Information ©2015 [employer]. This information is not intended to replace advice given to you by your health care provider. Make sure you discuss any questions you have with your health care provider. ° °

## 2014-10-26 NOTE — ED Notes (Signed)
Reports taking 3 pregnancy tests at home today. 2 were positive. 1 was negative.

## 2014-12-16 ENCOUNTER — Encounter (HOSPITAL_BASED_OUTPATIENT_CLINIC_OR_DEPARTMENT_OTHER): Payer: Self-pay | Admitting: *Deleted

## 2014-12-16 ENCOUNTER — Emergency Department (HOSPITAL_BASED_OUTPATIENT_CLINIC_OR_DEPARTMENT_OTHER)
Admission: EM | Admit: 2014-12-16 | Discharge: 2014-12-16 | Disposition: A | Discharge status: Home / Self Care | Payer: Managed Care, Other (non HMO) | Attending: Emergency Medicine | Admitting: Emergency Medicine

## 2014-12-16 DIAGNOSIS — S199XXA Unspecified injury of neck, initial encounter: Secondary | ICD-10-CM | POA: Insufficient documentation

## 2014-12-16 DIAGNOSIS — Y9389 Activity, other specified: Secondary | ICD-10-CM | POA: Diagnosis not present

## 2014-12-16 DIAGNOSIS — S0990XA Unspecified injury of head, initial encounter: Secondary | ICD-10-CM | POA: Diagnosis not present

## 2014-12-16 DIAGNOSIS — S0083XA Contusion of other part of head, initial encounter: Secondary | ICD-10-CM | POA: Diagnosis not present

## 2014-12-16 DIAGNOSIS — Z79899 Other long term (current) drug therapy: Secondary | ICD-10-CM | POA: Diagnosis not present

## 2014-12-16 DIAGNOSIS — T148XXA Other injury of unspecified body region, initial encounter: Secondary | ICD-10-CM

## 2014-12-16 DIAGNOSIS — Y9289 Other specified places as the place of occurrence of the external cause: Secondary | ICD-10-CM | POA: Insufficient documentation

## 2014-12-16 DIAGNOSIS — Y998 Other external cause status: Secondary | ICD-10-CM | POA: Insufficient documentation

## 2014-12-16 DIAGNOSIS — S0991XA Unspecified injury of ear, initial encounter: Secondary | ICD-10-CM | POA: Diagnosis present

## 2014-12-16 DIAGNOSIS — W228XXA Striking against or struck by other objects, initial encounter: Secondary | ICD-10-CM | POA: Insufficient documentation

## 2014-12-16 DIAGNOSIS — I10 Essential (primary) hypertension: Secondary | ICD-10-CM | POA: Insufficient documentation

## 2014-12-16 NOTE — Discharge Instructions (Signed)
Concussion °A concussion is a brain injury. It is caused by: °· A hit to the head. °· A quick and sudden movement (jolt) of the head or neck. °A concussion is usually not life threatening. Even so, it can cause serious problems. If you had a concussion before, you may have concussion-like problems after a hit to your head. °HOME CARE °General Instructions °· Follow your doctor's directions carefully. °· Take medicines only as told by your doctor. °· Only take medicines your doctor says are safe. °· Do not drink alcohol until your doctor says it is okay. Alcohol and some drugs can slow down healing. They can also put you at risk for further injury. °· If you are having trouble remembering things, write them down. °· Try to do one thing at a time if you get distracted easily. For example, do not watch TV while making dinner. °· Talk to your family members or close friends when making important decisions. °· Follow up with your doctor as told. °· Watch your symptoms. Tell others to do the same. Serious problems can sometimes happen after a concussion. Older adults are more likely to have these problems. °· Tell your teachers, school nurse, school counselor, coach, athletic trainer, or work manager about your concussion. Tell them about what you can or cannot do. They should watch to see if: °¨ It gets even harder for you to pay attention or concentrate. °¨ It gets even harder for you to remember things or learn new things. °¨ You need more time than normal to finish things. °¨ You become annoyed (irritable) more than before. °¨ You are not able to deal with stress as well. °¨ You have more problems than before. °· Rest. Make sure you: °¨ Get plenty of sleep at night. °¨ Go to sleep early. °¨ Go to bed at the same time every day. Try to wake up at the same time. °¨ Rest during the day. °¨ Take naps when you feel tired. °· Limit activities where you have to think a lot or concentrate. These include: °¨ Doing  homework. °¨ Doing work related to a job. °¨ Watching TV. °¨ Using the computer. °Returning To Your Regular Activities °Return to your normal activities slowly, not all at once. You must give your body and brain enough time to heal.  °· Do not play sports or do other athletic activities until your doctor says it is okay. °· Ask your doctor when you can drive, ride a bicycle, or work other vehicles or machines. Never do these things if you feel dizzy. °· Ask your doctor about when you can return to work or school. °Preventing Another Concussion °It is very important to avoid another brain injury, especially before you have healed. In rare cases, another injury can lead to permanent brain damage, brain swelling, or death. The risk of this is greatest during the first 7-10 days after your injury. Avoid injuries by:  °· Wearing a seat belt when riding in a car. °· Not drinking too much alcohol. °· Avoiding activities that could lead to a second concussion (such as contact sports). °· Wearing a helmet when doing activities like: °¨ Biking. °¨ Skiing. °¨ Skateboarding. °¨ Skating. °· Making your home safer by: °¨ Removing things from the floor or stairways that could make you trip. °¨ Using grab bars in bathrooms and handrails by stairs. °¨ Placing non-slip mats on floors and in bathtubs. °¨ Improve lighting in dark areas. °GET HELP IF: °· It   gets even harder for you to pay attention or concentrate.  It gets even harder for you to remember things or learn new things.  You need more time than normal to finish things.  You become annoyed (irritable) more than before.  You are not able to deal with stress as well.  You have more problems than before.  You have problems keeping your balance.  You are not able to react quickly when you should. Get help if you have any of these problems for more than 2 weeks:   Lasting (chronic) headaches.  Dizziness or trouble balancing.  Feeling sick to your stomach  (nausea).  Seeing (vision) problems.  Being affected by noises or light more than normal.  Feeling sad, low, down in the dumps, blue, gloomy, or empty (depressed).  Mood changes (mood swings).  Feeling of fear or nervousness about what may happen (anxiety).  Feeling annoyed.  Memory problems.  Problems concentrating or paying attention.  Sleep problems.  Feeling tired all the time. GET HELP RIGHT AWAY IF:   You have bad headaches or your headaches get worse.  You have weakness (even if it is in one hand, leg, or part of the face).  You have loss of feeling (numbness).  You feel off balance.  You keep throwing up (vomiting).  You feel tired.  One black center of your eye (pupil) is larger than the other.  You twitch or shake violently (convulse).  Your speech is not clear (slurred).  You are more confused, easily angered (agitated), or annoyed than before.  You have more trouble resting than before.  You are unable to recognize people or places.  You have neck pain.  It is difficult to wake you up.  You have unusual behavior changes.  You pass out (lose consciousness). MAKE SURE YOU:   Understand these instructions.  Will watch your condition.  Will get help right away if you are not doing well or get worse. Document Released: 05/15/2009 Document Revised: 10/11/2013 Document Reviewed: 12/17/2012 [facility] CExitCare Patient Information 2015 [place], [place]. This information is not intended to replace advice given to you by your health care provider. Make sure you discuss any questions you have with your health care provider.  I would recommend taking Motrin or Naprosyn as needed. Work note provided. Return for any new or worse symptoms at all.

## 2014-12-16 NOTE — ED Provider Notes (Signed)
CSN: [id number]     Arrival date & time 12/16/14  3086 History   First MD Initiated Contact with Patient 12/16/14 [phone]     Chief Complaint  Patient presents with  . Otalgia     (Consider location/radiation/quality/duration/timing/severity/associated sxs/prior Treatment) Patient is a 24 y.o. female presenting with ear pain. The history is provided by the patient.  Otalgia Associated symptoms: ear discharge, headaches and neck pain   Associated symptoms: no abdominal pain, no congestion, no fever and no rash    patient was hit in the right ear last evening around midnight. Was hit by dryer door. No loss of consciousness. But patient did have some dizziness and some nausea last evening this resolved this morning. There was some bleeding from the right ear patient not sure whether came from the ear canal or the earlobe. Patient did have an ear ring that was knocked out but did not tear through. Patient was some mild discomfort on the right lateral side of the neck. But no posterior neck pain. No other injuries. Patient is concerned mostly about the ear.  Past Medical History  Diagnosis Date  . Abnormal Pap smear     repeat was normal  . Hypertension     hx of in 9th, 10th grade; states lifestyle changes helped; was never put on med because "I was too young".   Past Surgical History  Procedure Laterality Date  . Hernia repair    . Tonsillectomy    . Adenoidectomy     No family history on file. History  Substance Use Topics  . Smoking status: Never Smoker   . Smokeless tobacco: Never Used  . Alcohol Use: No   OB History    Gravida Para Term Preterm AB TAB SAB Ectopic Multiple Living   0 0 0 0 0 0 2     Review of Systems  Constitutional: Negative for fever.  HENT: Positive for ear discharge and ear pain. Negative for congestion, dental problem, nosebleeds, trouble swallowing and voice change.   Eyes: Negative for redness.  Respiratory: Negative for shortness of breath.    Cardiovascular: Negative for chest pain.  Gastrointestinal: Negative for abdominal pain.  Genitourinary: Negative for dysuria.  Musculoskeletal: Positive for neck pain. Negative for back pain.  Skin: Negative for rash.  Neurological: Positive for dizziness and headaches.  Hematological: Does not bruise/bleed easily.  Psychiatric/Behavioral: Negative for confusion.      Allergies  Review of patient's allergies indicates no known allergies.  Home Medications   Prior to Admission medications   Medication Sig Start Date End Date Taking? Authorizing Provider  Prenatal Vit-Fe Fumarate-FA (PRENATAL MULTIVITAMIN) TABS tablet Take 1 tablet by mouth daily at 12 noon.   Yes Historical Provider, MD  ondansetron (ZOFRAN ODT) 4 MG disintegrating tablet  ODT q4 hours prn nausea/vomit 07/08/14   Mirian Mo, MD   BP 122/79 mmHg  Pulse 78  Temp(Src) 98.5 F (36.9 C) (Oral)  Resp 16  Ht  (1.499 m)  Wt 135 lb (61.236 kg)  BMI 27.25 kg/m2  SpO2 100%  LMP 03/10/2014 Physical Exam  Constitutional: She is oriented to person, place, and time. She appears well-developed and well-nourished. No distress.  HENT:  Head: Normocephalic.  Right Ear: External ear normal.  Left Ear: External ear normal.  Patient with a bruise below the earlobe on the right measuring about 2 cm. Evidence of some swelling to the earlobe itself no evidence of any significant hematoma to the rest  of the ear. No significant hematoma and areas where the cartilages. Ear canal has no bleeding tympanic membrane is normal.  Eyes: Conjunctivae and EOM are normal. Pupils are equal, round, and reactive to light.  Neck: Normal range of motion. Neck supple.  Cardiovascular: Normal rate, regular rhythm and normal heart sounds.   No murmur heard. Pulmonary/Chest: Effort normal and breath sounds normal. No respiratory distress.  Abdominal: Soft. Bowel sounds are normal. There is no tenderness.  Musculoskeletal: Normal range of  motion. She exhibits no tenderness.  Neurological: She is alert and oriented to person, place, and time. No cranial nerve deficit. She exhibits normal muscle tone. Coordination normal.  Skin: Skin is warm. No rash noted.  Nursing note and vitals reviewed.   ED Course  Procedures (including critical care time) Labs Review Labs Reviewed - No data to display  Imaging Review No results found.   EKG Interpretation None      MDM   Final diagnoses:  Head injury, initial encounter  Contusion    Patient status post injury to right ear last evening around midnight get hit by dryer door. Had some bleeding from the right ear has tenderness behind the right ear. No loss of consciousness. Patient initially was dizzy and had some nausea but that's all resolved today. No other injuries.  Patient has contusion of right below the earlobe has some evidence of some swelling to the earlobe itself no hematoma to the pinna. Patient's teeth feel aligned. No evidence of any jaw fracture or subluxation or dislocation. Patient can be treated symptomatically. Patient most likely did have a mild concussion. No evidence of any signs of serious head injury. Head CT not required. No significant neck pain.    Vanetta MuldersScott Jawanda Passey, MD 12/16/14 [phone]

## 2014-12-16 NOTE — ED Notes (Signed)
Pt was hit in the right ear by her dryer door last night. She now has bruising behind her her and pain in her jaw. Her earrings were also pulled out but no lac noted.

## 2015-04-03 ENCOUNTER — Emergency Department (HOSPITAL_BASED_OUTPATIENT_CLINIC_OR_DEPARTMENT_OTHER): Payer: Managed Care, Other (non HMO)

## 2015-04-03 ENCOUNTER — Encounter (HOSPITAL_BASED_OUTPATIENT_CLINIC_OR_DEPARTMENT_OTHER): Payer: Self-pay | Admitting: *Deleted

## 2015-04-03 ENCOUNTER — Emergency Department (HOSPITAL_BASED_OUTPATIENT_CLINIC_OR_DEPARTMENT_OTHER)
Admission: EM | Admit: 2015-04-03 | Discharge: 2015-04-03 | Disposition: A | Discharge status: Home / Self Care | Payer: Managed Care, Other (non HMO) | Attending: Emergency Medicine | Admitting: Emergency Medicine

## 2015-04-03 DIAGNOSIS — R102 Pelvic and perineal pain: Secondary | ICD-10-CM | POA: Insufficient documentation

## 2015-04-03 DIAGNOSIS — O9989 Other specified diseases and conditions complicating pregnancy, childbirth and the puerperium: Secondary | ICD-10-CM | POA: Insufficient documentation

## 2015-04-03 DIAGNOSIS — R1031 Right lower quadrant pain: Secondary | ICD-10-CM

## 2015-04-03 DIAGNOSIS — O10011 Pre-existing essential hypertension complicating pregnancy, first trimester: Secondary | ICD-10-CM | POA: Insufficient documentation

## 2015-04-03 DIAGNOSIS — O26899 Other specified pregnancy related conditions, unspecified trimester: Secondary | ICD-10-CM

## 2015-04-03 DIAGNOSIS — O209 Hemorrhage in early pregnancy, unspecified: Secondary | ICD-10-CM | POA: Diagnosis not present

## 2015-04-03 DIAGNOSIS — R634 Abnormal weight loss: Secondary | ICD-10-CM | POA: Insufficient documentation

## 2015-04-03 LAB — COMPREHENSIVE METABOLIC PANEL
ALBUMIN: 4.5 g/dL (ref 3.5–5.0)
ALK PHOS: 66 U/L (ref 38–126)
ALT: 9 U/L — ABNORMAL LOW (ref 14–54)
ANION GAP: 6 (ref 5–15)
AST: 18 U/L (ref 15–41)
BUN: 12 mg/dL (ref 6–20)
CALCIUM: 8.8 mg/dL — AB (ref 8.9–10.3)
CO2: 24 mmol/L (ref 22–32)
CREATININE: 0.71 mg/dL (ref 0.44–1.00)
Chloride: 110 mmol/L (ref 101–111)
Glucose, Bld: 85 mg/dL (ref 65–99)
Potassium: 3.8 mmol/L (ref 3.5–5.1)
SODIUM: 140 mmol/L (ref 135–145)
TOTAL PROTEIN: 8 g/dL (ref 6.5–8.1)
Total Bilirubin: 0.6 mg/dL (ref 0.3–1.2)

## 2015-04-03 LAB — CBC WITH DIFFERENTIAL/PLATELET
BASOS PCT: 0 %
Basophils Absolute: 0 10*3/uL (ref 0.0–0.1)
EOS ABS: 0 10*3/uL (ref 0.0–0.7)
Eosinophils Relative: 1 %
HCT: 38.1 % (ref 36.0–46.0)
HEMOGLOBIN: 12.5 g/dL (ref 12.0–15.0)
Lymphocytes Relative: 20 %
Lymphs Abs: 1.7 10*3/uL (ref 0.7–4.0)
MCH: 31 pg (ref 26.0–34.0)
MCHC: 32.8 g/dL (ref 30.0–36.0)
MCV: 94.5 fL (ref 78.0–100.0)
Monocytes Absolute: 0.7 10*3/uL (ref 0.1–1.0)
Monocytes Relative: 9 %
NEUTROS PCT: 70 %
Neutro Abs: 5.9 10*3/uL (ref 1.7–7.7)
Platelets: 265 10*3/uL (ref 150–400)
RBC: 4.03 MIL/uL (ref 3.87–5.11)
RDW: 11.7 % (ref 11.5–15.5)
WBC: 8.3 10*3/uL (ref 4.0–10.5)

## 2015-04-03 LAB — URINALYSIS, ROUTINE W REFLEX MICROSCOPIC
Bilirubin Urine: NEGATIVE
GLUCOSE, UA: NEGATIVE mg/dL
KETONES UR: 15 mg/dL — AB
Nitrite: NEGATIVE
PH: 5.5 (ref 5.0–8.0)
PROTEIN: 30 mg/dL — AB
Specific Gravity, Urine: 1.029 (ref 1.005–1.030)
Urobilinogen, UA: 1 mg/dL (ref 0.0–1.0)

## 2015-04-03 LAB — URINE MICROSCOPIC-ADD ON

## 2015-04-03 LAB — ABO/RH: ABO/RH(D): O POS

## 2015-04-03 LAB — HCG, QUANTITATIVE, PREGNANCY: hCG, Beta Chain, Quant, S: 276 m[IU]/mL — ABNORMAL HIGH (ref <5)

## 2015-04-03 MED ORDER — OXYCODONE-ACETAMINOPHEN 5-325 MG PO TABS: 2.0000 | ORAL_TABLET | ORAL | Status: AC | PRN

## 2015-04-03 MED ORDER — OXYCODONE-ACETAMINOPHEN 5-325 MG PO TABS
1.0000 | ORAL_TABLET | Freq: Once | ORAL | Status: AC
  Administered 2015-04-03: 1 via ORAL
  Filled 2015-04-03: qty 1

## 2015-04-03 MED ORDER — SODIUM CHLORIDE 0.9 % IV BOLUS (SEPSIS)
1000.0000 mL | Freq: Once | INTRAVENOUS | Status: AC
  Administered 2015-04-03: 1000 mL via INTRAVENOUS

## 2015-04-03 NOTE — Discharge Instructions (Signed)
Ms. Junius CreamerRice,  Nice meeting you. Make sure you follow-up in 48 hours with your primary care or obstetrician/gynecologist to get your QUANTITATIVE hCG test checked to make sure the levels are going down (they are 276 today). If the levels are going up, you need to return to the emergency department immediately.   Ortencia KickS. Nicole Muadh Creasy, PA-C

## 2015-04-03 NOTE — ED Notes (Signed)
Patient transported to Ultrasound 

## 2015-04-03 NOTE — ED Notes (Signed)
States she was pregnant and states she had a miscarriage on Saturday. C/o pain in side. LMP 02-13-15. States she saturated 3 pads in 1 hour on Saturday.

## 2015-04-03 NOTE — ED Provider Notes (Signed)
CSN: [id number]     Arrival date & time 04/03/15  0945 History   First MD Initiated Contact with Patient 04/03/15 [phone]     Chief complaint: Vaginal bleeding during pregnancy   HPI  Michele [name] is a 24yo F PMH hypertension pw 3 day history of right lower abdominal pain and bleeding. She describes her pain as non-radiating, 7/10 pain scale, crampy, constant. She was bleeding through approximately 3 pads/hr Saturday and Sunday. Pt still bleeding today but not as much (currently 1 pad/3 hours). On Saturday, she passed what she thought was a clot but it "looked a little different." She endorses a 12 lb weight loss over the last 2 months but states this is normal for her when she becomes pregnant. She denies fever, chills, dizziness, CP, SOB, palpitations, N/V/D, alleviation attempts or aggravating factors.   LMP: 02/13/15. She states had a positive hCG at home 10/8. No prenatal care at this time.   Past Medical History  Diagnosis Date  . Abnormal Pap smear     repeat was normal  . Hypertension     hx of in 9th, 10th grade; states lifestyle changes helped; was never put on med because "I was too young".   Past Surgical History  Procedure Laterality Date  . Hernia repair    . Tonsillectomy    . Adenoidectomy     No family history on file. Social History  Substance Use Topics  . Smoking status: Never Smoker   . Smokeless tobacco: Never Used  . Alcohol Use: No   OB History    Gravida Para Term Preterm AB TAB SAB Ectopic Multiple Living   4 3 2 0 0 0 0 0 0 2     Review of Systems  Constitutional: Positive for unexpected weight change. Negative for fever, chills, diaphoresis, activity change, appetite change and fatigue.       12  lb loss (unintentional) over last 2 months.   Cardiovascular: Negative.   Gastrointestinal: Positive for abdominal pain. Negative for nausea, vomiting, diarrhea, constipation, blood in stool, abdominal distention, anal bleeding and rectal pain.       Right lower   Genitourinary: Positive for vaginal bleeding, menstrual problem and pelvic pain. Negative for dysuria, urgency, frequency, hematuria, flank pain, decreased urine volume, vaginal discharge, enuresis, difficulty urinating, genital sores, vaginal pain and dyspareunia.  Musculoskeletal: Negative.   Skin: Negative.       Allergies  Review of patient's allergies indicates no known allergies.  Home Medications   Prior to Admission medications   Medication Sig Start Date End Date Taking? Authorizing Provider  oxyCODONE-acetaminophen (PERCOCET/ROXICET) 5-325 MG tablet Take 2 tablets by mouth every 4 (four) hours as needed for severe pain. 04/03/15   Melton Krebs, PA-C   BP 123/66 mmHg  Pulse 70  Temp(Src) 98.6 F (37 C) (Oral)  Resp 20  Ht 4\' 10"  (1.473 m)  Wt 132 lb (59.875 kg)  BMI 27.60 kg/m2  SpO2 100%  LMP 02/13/2015 Physical Exam  Constitutional: She appears well-developed and well-nourished. No distress.  HENT:  Head: Normocephalic and atraumatic.  Mouth/Throat: Oropharynx is clear and moist. No oropharyngeal exudate.  Eyes: Conjunctivae are normal. Pupils are equal, round, and reactive to light. Right eye exhibits no discharge. Left eye exhibits no discharge. No scleral icterus.  Cardiovascular: Normal rate, regular rhythm, normal heart sounds and intact distal pulses.  Exam reveals no gallop and no friction rub.   No murmur heard. Pulmonary/Chest: Effort normal and breath sounds normal.  No respiratory distress. She has no wheezes. She has no rales. She exhibits no tenderness.  Abdominal: Soft. Bowel sounds are normal. She exhibits no distension and no mass. There is tenderness. There is guarding. There is no rebound.  Tenderness RLQ. Positive McBurneys and Psoas.   Genitourinary: Vagina normal.  Cervical os closed. Blood in vaginal vault without clots. Right adnexal tenderness. No cervical motion tenderness.   Musculoskeletal: Normal range of motion. She exhibits no  edema.  Neurological: She is alert. Coordination normal.  Skin: Skin is warm and dry. No rash noted. She is not diaphoretic. No erythema.  Psychiatric: She has a normal mood and affect. Her behavior is normal.  Nursing note and vitals reviewed.   ED Course  Procedures  Pelvic exam: VULVA: normal appearing vulva with no masses, tenderness or lesions, VAGINA: normal appearing vagina with normal color and discharge, no lesions, CERVIX: normal appearing cervix without discharge or lesions, UTERUS: tenderness bilateral, ADNEXA: tenderness bilateral.   Labs Review Labs Reviewed  HCG, QUANTITATIVE, PREGNANCY - Abnormal; Notable for the following:    hCG, Beta Chain, Quant, S 276 (*)    All other components within normal limits  COMPREHENSIVE METABOLIC PANEL - Abnormal; Notable for the following:    Calcium 8.8 (*)    ALT 9 (*)    All other components within normal limits  URINALYSIS, ROUTINE W REFLEX MICROSCOPIC (NOT AT [facility]) - Abnormal; Notable for the following:    APPearance CLOUDY (*)    Hgb urine dipstick LARGE (*)    Ketones, ur 15 (*)    Protein, ur 30 (*)    Leukocytes, UA SMALL (*)    All other components within normal limits  URINE MICROSCOPIC-ADD ON - Abnormal; Notable for the following:    Squamous Epithelial / LPF MANY (*)    Bacteria, UA MANY (*)    All other components within normal limits  CBC WITH DIFFERENTIAL/PLATELET  RPR  HIV ANTIBODY (ROUTINE TESTING)  ABO/RH    Imaging Results       US OB Comp Less 14 Wks (Final result) Result time: 04/03/15 11:42:24   Final result by Rad Results In Interface (04/03/15 11:42:24)   Narrative:   CLINICAL DATA: Miscarriage 2 days ago, vaginal bleeding, sharp right lower quadrant pain  EXAM: OBSTETRIC <14 WK US AND TRANSVAGINAL OB  US DOPPLER ULTRASOUND OF OVARIES  TECHNIQUE: Both transabdominal and transvaginal ultrasound examinations were performed for complete evaluation of the gestation as well as  the maternal uterus, adnexal regions, and pelvic cul-de-sac. Transvaginal technique was performed to assess early pregnancy.  Color and duplex Doppler ultrasound was utilized to evaluate blood flow to the ovaries.  COMPARISON: None.  FINDINGS: Intrauterine gestational sac: Not visualized  Maternal uterus/adnexae: Endometrium is focally thickened/heterogeneous, measuring 9 mm, without color Doppler flow. In the setting of recent miscarriage, this most likely reflects blood products.  Left ovary is within normal limits, measuring 2.7 x 1.4 x 2.0 cm. 1.1 cm left ovarian corpus luteal cyst.  Right ovary is only visualized transabdominally but is within normal limits, measuring 3.1 x 1.4 x 2.7 cm.  Trace pelvic fluid.  Pulsed Doppler evaluation of both ovaries demonstrates normal appearing low-resistance arterial and venous waveforms.  IMPRESSION: No IUP is visualized. By definition, this reflects a pregnancy of unknown location. Differential considerations include early normal IUP, abnormal IUP/missed abortion, or nonvisualized ectopic pregnancy. In this clinical setting, this appearance is compatible with the presumed diagnosis of recent miscarriage.  Endometrium is focally thickened/  heterogeneous, measuring 9 mm, without color Doppler flow. In the setting of recent miscarriage, this most likely reflects blood products. If abnormal bleeding persists, and beta HCG remains elevated, consider follow-up pelvic ultrasound in 10-14 days to exclude retained products of conception.  No evidence of ovarian torsion.   Electronically Signed By: Charline Bills M.D. On: 04/03/2015 11:42          US OB Transvaginal (Final result) Result time: 04/03/15 11:42:24   Final result by Rad Results In Interface (04/03/15 11:42:24)   Narrative:   CLINICAL DATA: Miscarriage 2 days ago, vaginal bleeding, sharp right lower quadrant pain  EXAM: OBSTETRIC <14 WK [place] AND  TRANSVAGINAL OB  US DOPPLER ULTRASOUND OF OVARIES  TECHNIQUE: Both transabdominal and transvaginal ultrasound examinations were performed for complete evaluation of the gestation as well as the maternal uterus, adnexal regions, and pelvic cul-de-sac. Transvaginal technique was performed to assess early pregnancy.  Color and duplex Doppler ultrasound was utilized to evaluate blood flow to the ovaries.  COMPARISON: None.  FINDINGS: Intrauterine gestational sac: Not visualized  Maternal uterus/adnexae: Endometrium is focally thickened/heterogeneous, measuring 9 mm, without color Doppler flow. In the setting of recent miscarriage, this most likely reflects blood products.  Left ovary is within normal limits, measuring 2.7 x 1.4 x 2.0 cm. 1.1 cm left ovarian corpus luteal cyst.  Right ovary is only visualized transabdominally but is within normal limits, measuring 3.1 x 1.4 x 2.7 cm.  Trace pelvic fluid.  Pulsed Doppler evaluation of both ovaries demonstrates normal appearing low-resistance arterial and venous waveforms.  IMPRESSION: No IUP is visualized. By definition, this reflects a pregnancy of unknown location. Differential considerations include early normal IUP, abnormal IUP/missed abortion, or nonvisualized ectopic pregnancy. In this clinical setting, this appearance is compatible with the presumed diagnosis of recent miscarriage.  Endometrium is focally thickened/ heterogeneous, measuring 9 mm, without color Doppler flow. In the setting of recent miscarriage, this most likely reflects blood products. If abnormal bleeding persists, and beta HCG remains elevated, consider follow-up pelvic ultrasound in 10-14 days to exclude retained products of conception.  No evidence of ovarian torsion.   Electronically Signed By: Charline Bills M.D. On: 04/03/2015 11:42          [place] Art/Ven Flow Abd Pelv Doppler (Final result) Result time: 04/03/15 11:42:24    Final result by Rad Results In Interface (04/03/15 11:42:24)   Narrative:   CLINICAL DATA: Miscarriage 2 days ago, vaginal bleeding, sharp right lower quadrant pain  EXAM: OBSTETRIC <14 WK [place] AND TRANSVAGINAL OB  US DOPPLER ULTRASOUND OF OVARIES  TECHNIQUE: Both transabdominal and transvaginal ultrasound examinations were performed for complete evaluation of the gestation as well as the maternal uterus, adnexal regions, and pelvic cul-de-sac. Transvaginal technique was performed to assess early pregnancy.  Color and duplex Doppler ultrasound was utilized to evaluate blood flow to the ovaries.  COMPARISON: None.  FINDINGS: Intrauterine gestational sac: Not visualized  Maternal uterus/adnexae: Endometrium is focally thickened/heterogeneous, measuring 9 mm, without color Doppler flow. In the setting of recent miscarriage, this most likely reflects blood products.  Left ovary is within normal limits, measuring 2.7 x 1.4 x 2.0 cm. 1.1 cm left ovarian corpus luteal cyst.  Right ovary is only visualized transabdominally but is within normal limits, measuring 3.1 x 1.4 x 2.7 cm.  Trace pelvic fluid.  Pulsed Doppler evaluation of both ovaries demonstrates normal appearing low-resistance arterial and venous waveforms.  IMPRESSION: No IUP is visualized. By definition, this reflects a pregnancy of unknown location.  Differential considerations include early normal IUP, abnormal IUP/missed abortion, or nonvisualized ectopic pregnancy. In this clinical setting, this appearance is compatible with the presumed diagnosis of recent miscarriage.  Endometrium is focally thickened/ heterogeneous, measuring 9 mm, without color Doppler flow. In the setting of recent miscarriage, this most likely reflects blood products. If abnormal bleeding persists, and beta HCG remains elevated, consider follow-up pelvic ultrasound in 10-14 days to exclude retained products of conception.  No  evidence of ovarian torsion.   Electronically Signed By: Charline Bills M.D. On: 04/03/2015 11:42   I have personally reviewed and evaluated these images and lab results as part of my medical decision-making.   MDM   Final diagnoses:  Pelvic pain in pregnant patient at less than [redacted] weeks gestation   Will give percocet for pain. Evaluate for ectopic, tuboovarian abscess, torsion, appendicitis: quantitative hCG, type & screen, US OB transvaginal and complete with art/ven flow, UA, CMP, CBC. Will do pelvic exam and order STD panel.   US shows mostly likely recent miscarriage. hCG 276. Marland Kitchen   UA shows bacteria, but most likely contamination.   Patient feeling much better after percocet. Instructed patient to follow-up and re-check quantitative hCG in 48 hours to monitor levels and make sure they are trending down. Patient can be safely discharged home.     Melton Krebs, PA-C 04/08/15 1714  Marily Memos, MD 04/08/15 [phone]

## 2015-04-04 LAB — GC/CHLAMYDIA PROBE AMP (~~LOC~~) NOT AT ARMC
Chlamydia: POSITIVE — AB
Neisseria Gonorrhea: NEGATIVE

## 2015-04-04 LAB — HIV ANTIBODY (ROUTINE TESTING W REFLEX): HIV SCREEN 4TH GENERATION: NONREACTIVE

## 2015-04-04 LAB — RPR: RPR Ser Ql: NONREACTIVE

## 2015-04-05 ENCOUNTER — Telehealth (HOSPITAL_BASED_OUTPATIENT_CLINIC_OR_DEPARTMENT_OTHER): Payer: Self-pay | Admitting: Emergency Medicine

## 2015-04-05 NOTE — Telephone Encounter (Signed)
Chart handoff to EDP for treatment plan for + Chlamydia 

## 2015-04-08 ENCOUNTER — Telehealth (HOSPITAL_BASED_OUTPATIENT_CLINIC_OR_DEPARTMENT_OTHER): Payer: Self-pay | Admitting: Emergency Medicine

## 2015-07-04 ENCOUNTER — Other Ambulatory Visit: Payer: Self-pay | Admitting: Obstetrics and Gynecology

## 2015-07-05 LAB — CYTOLOGY - PAP

## 2015-10-31 IMAGING — US US OB COMP LESS 14 WK
1 series · 13 of 28 positions shown · non-contrast
Comparison: None.

CLINICAL DATA: Mid pelvic pain and vaginal spotting. Positive home
pregnancy test. Beta HCG level, 778.

EXAM:
OBSTETRIC <14 WK US AND TRANSVAGINAL OB US
TECHNIQUE: Both transabdominal and transvaginal ultrasound examinations were
performed for complete evaluation of the gestation as well as the
maternal uterus, adnexal regions, and pelvic cul-de-sac.
Transvaginal technique was performed to assess early pregnancy.

[Series 1: us ob comp less 14 wk · 0.20mm/px · 13 of 59 slices shown]
[im 3/59]
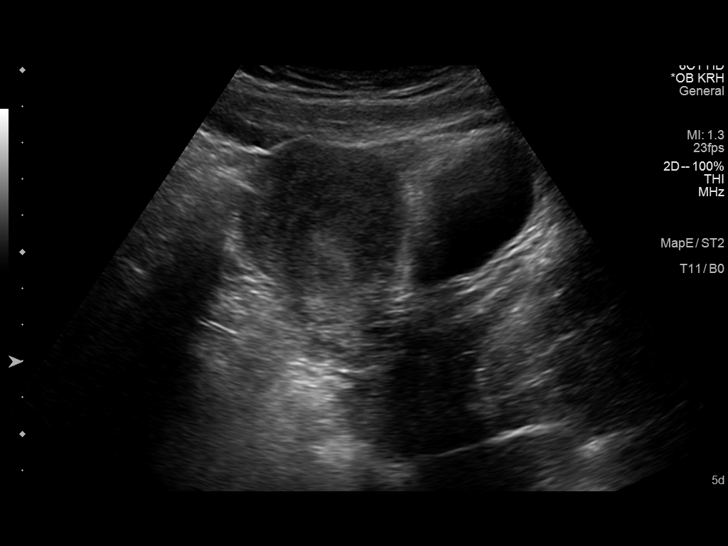
[im 7/59]
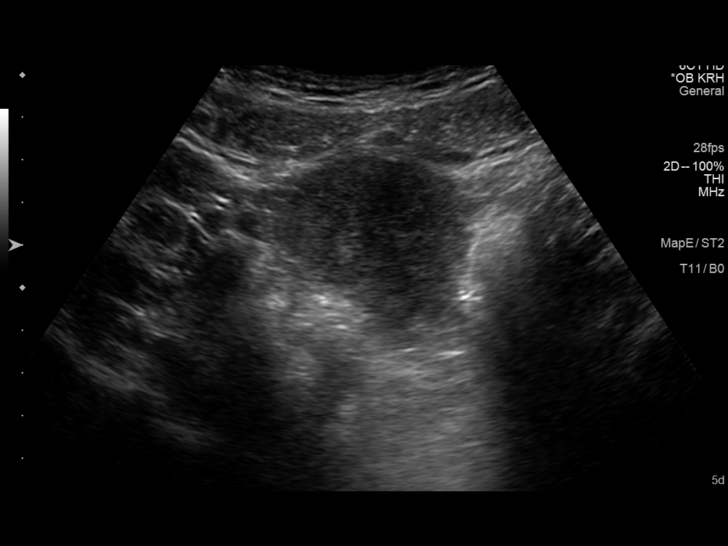
[im 11/59]
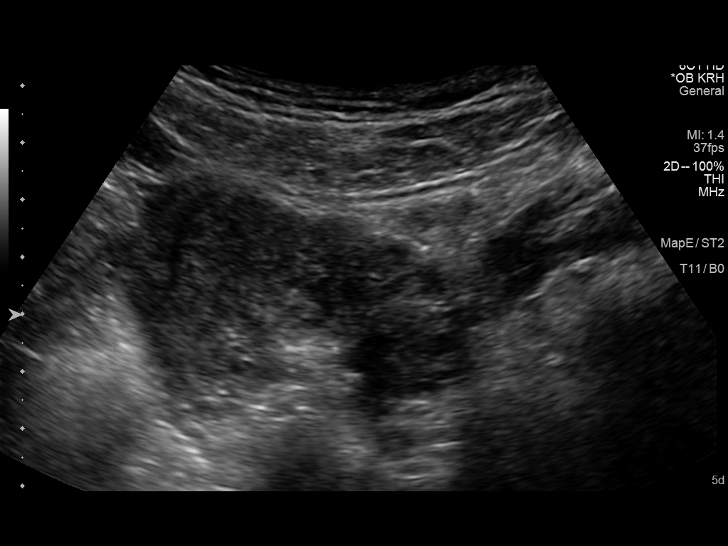
[im 16/59]
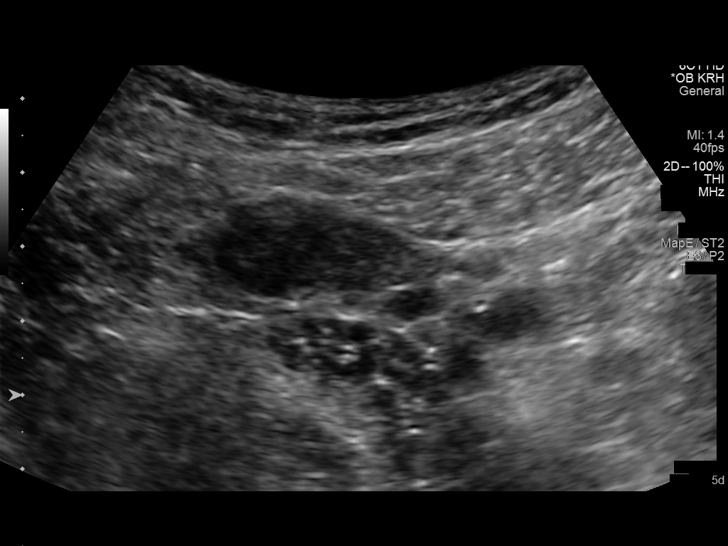
[im 20/59]
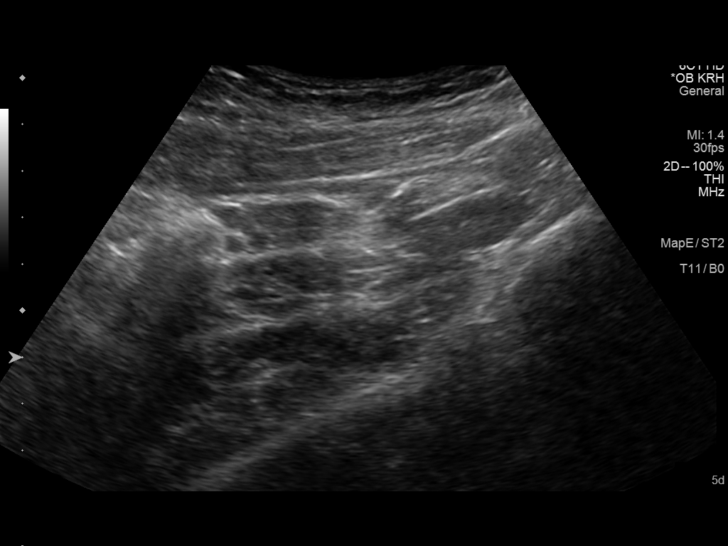
[im 24/59]
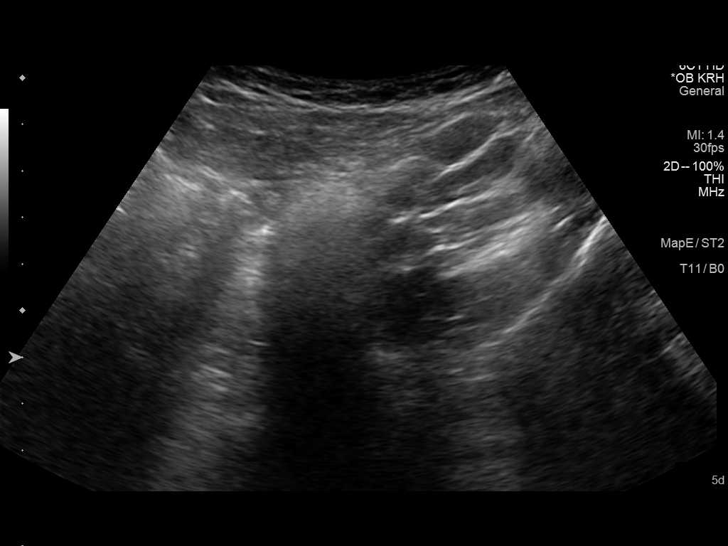
[im 31/59]
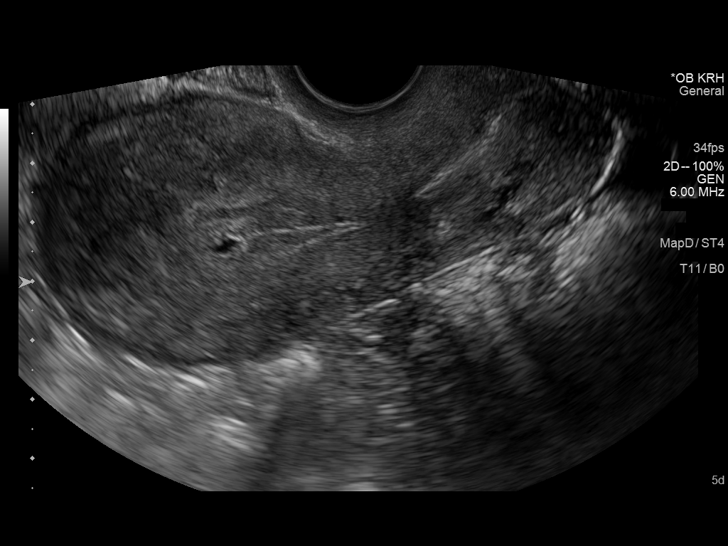
[im 35/59]
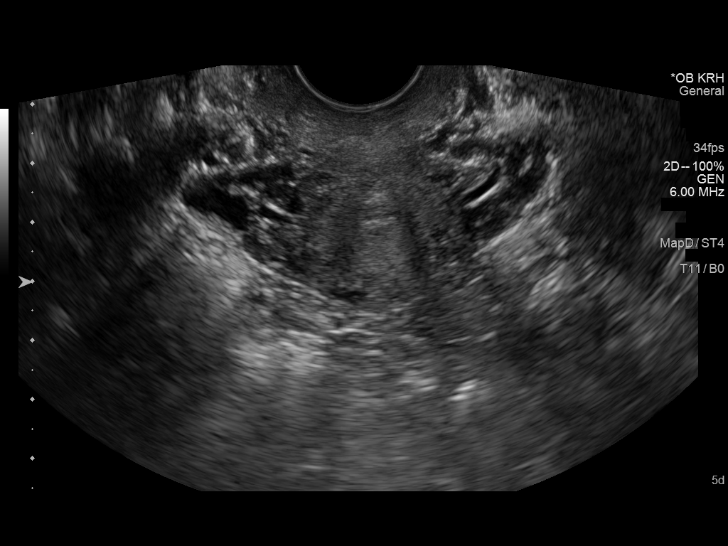
[im 39/59]
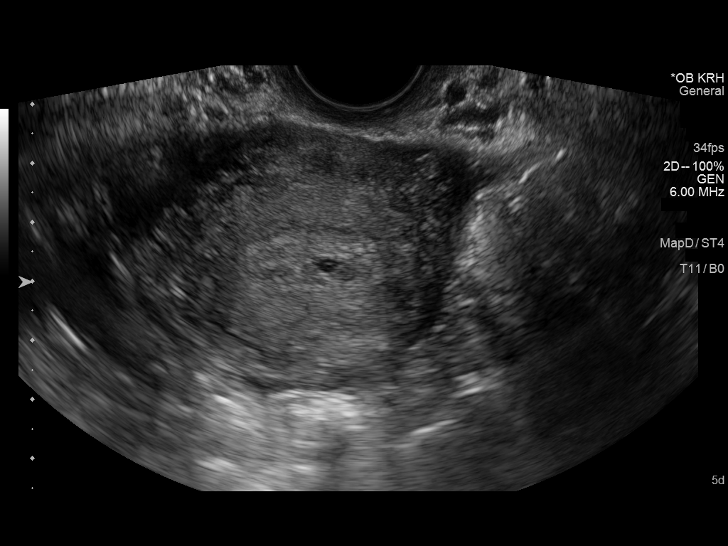
[im 43/59]
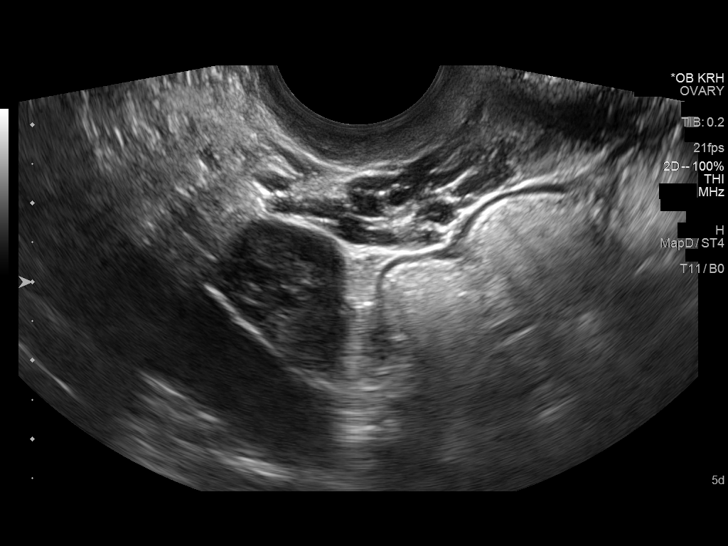
[im 48/59]
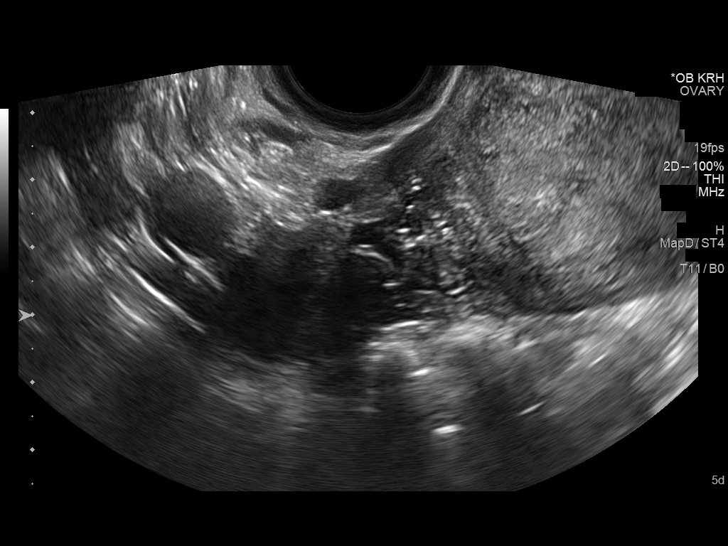
[im 52/59]
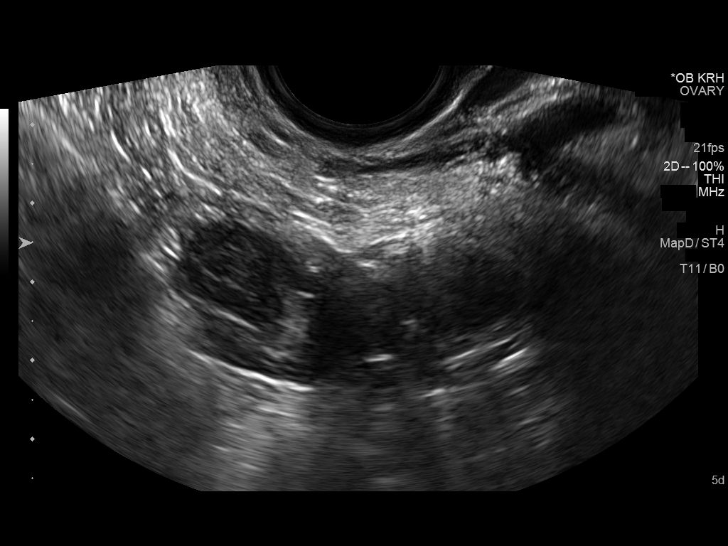
[im 56/59]
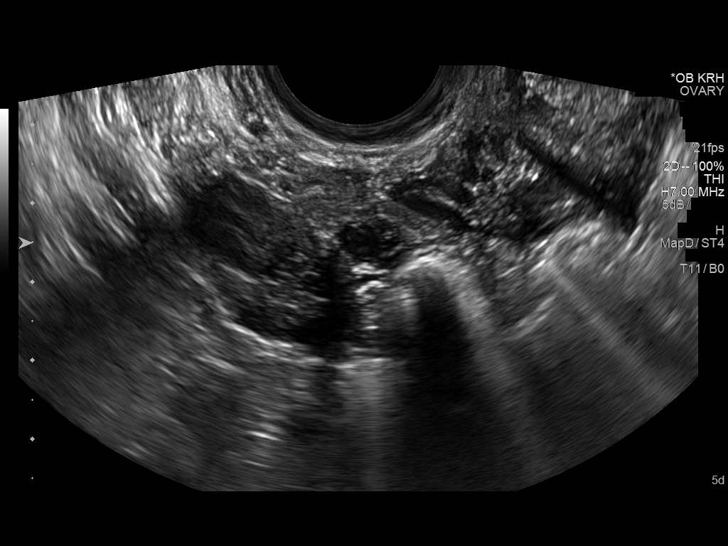

[13 of 28 positions shown; findings below may reference images not displayed]

FINDINGS: Intrauterine gestational sac: There is elongated cystic structure
within the left upper endometrium measuring 6.4 mm x 1.8 x 5.4 mm.
This could reflect an early gestational sac.

Yolk sac:  No

Embryo:  No

Cardiac Activity: No

Maternal uterus/adnexae: No uterine masses. No endometrial fluid or
endometrial mass. Cervix is closed.

Ovaries are normal in size. 11 mm hypoechoic area arises from the
right ovary likely corpus luteum. No adnexal masses. Trace pelvic
free fluid.
IMPRESSION: 1. Elongated cystic structure in the upper endometrium may reflect
an early gestational sac. However, no yolk sac or embryo seen.
Probable early intrauterine gestational sac, but no yolk sac, fetal
pole, or cardiac activity yet visualized. Recommend follow-up
quantitative B-HCG levels and follow-up US in 14 days to confirm and
assess viability. This recommendation follows SRU consensus
guidelines: Diagnostic Criteria for Nonviable Pregnancy Early in the
First Trimester. N Engl J Med 0818; [DATE].
2. No uterine abnormality.  Normal ovaries and adnexa.

## 2015-11-02 IMAGING — US US OB TRANSVAGINAL
1 series · 14 of 28 positions shown · non-contrast
Comparison: 04/19/2014

CLINICAL DATA: Vaginal bleeding in pregnancy, first trimester. Left
lower quadrant and back pain. Decreasing beta HCG level.

EXAM:
TRANSVAGINAL OB ULTRASOUND
TECHNIQUE: Transvaginal ultrasound was performed for complete evaluation of the
gestation as well as the maternal uterus, adnexal regions, and
pelvic cul-de-sac.

[Series 1: us ob transvaginal · 0.11mm/px · 14 of 34 slices shown]
[im 2/34]
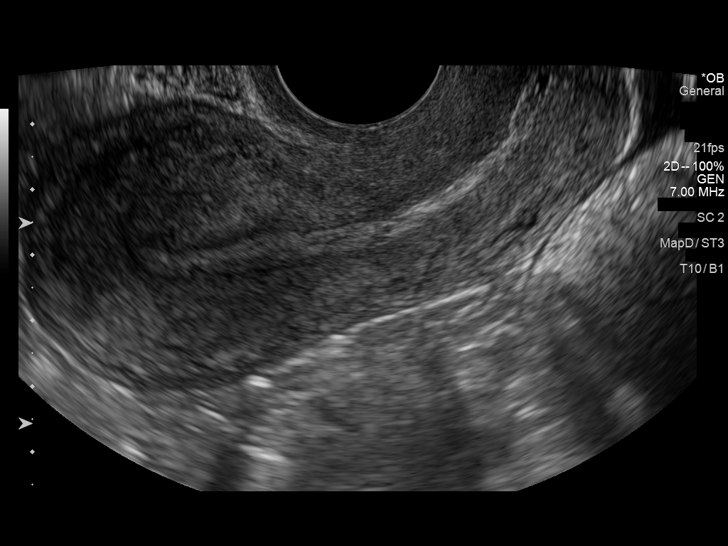
[im 4/34]
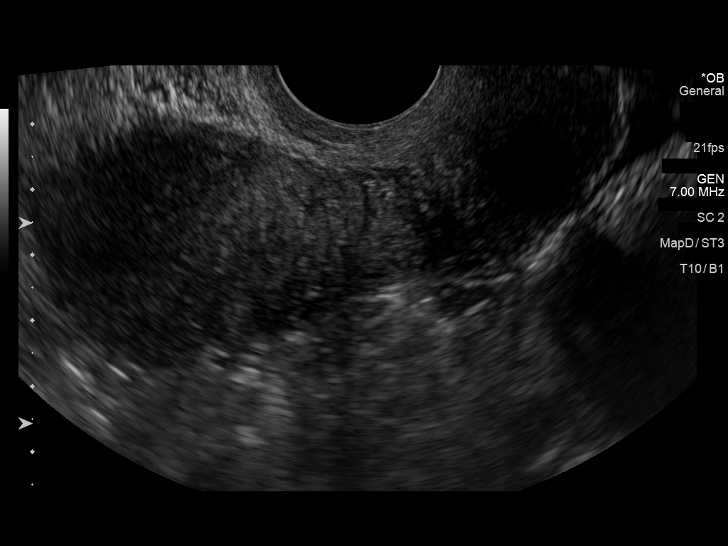
[im 7/34]
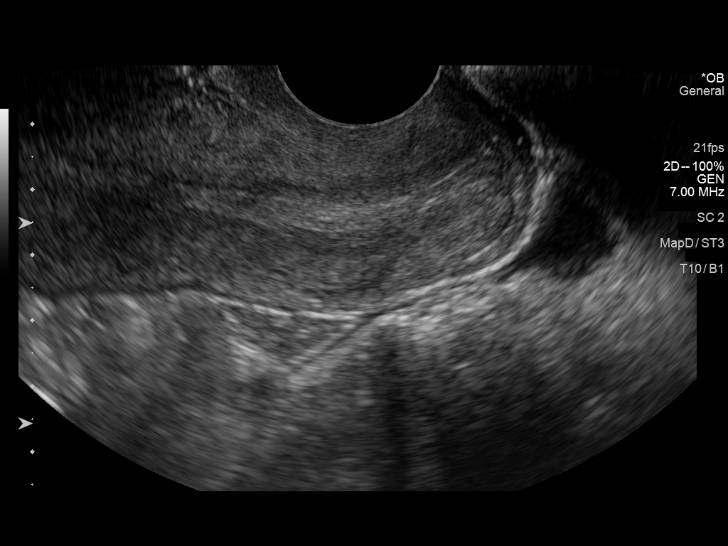
[im 9/34]
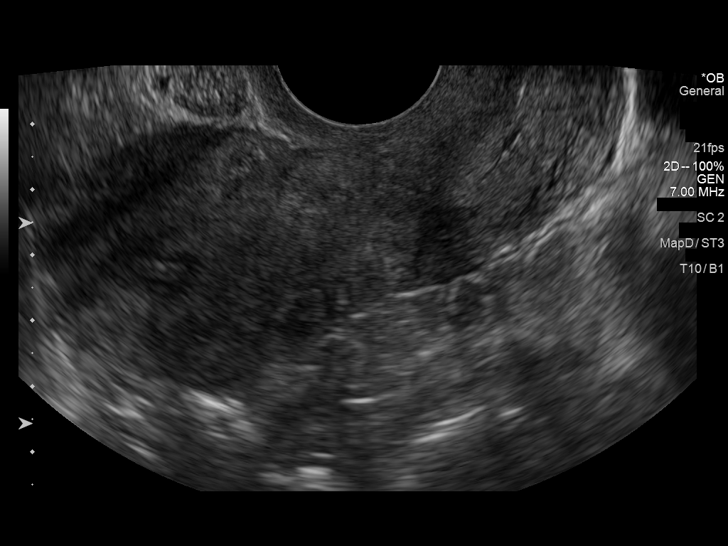
[im 12/34]
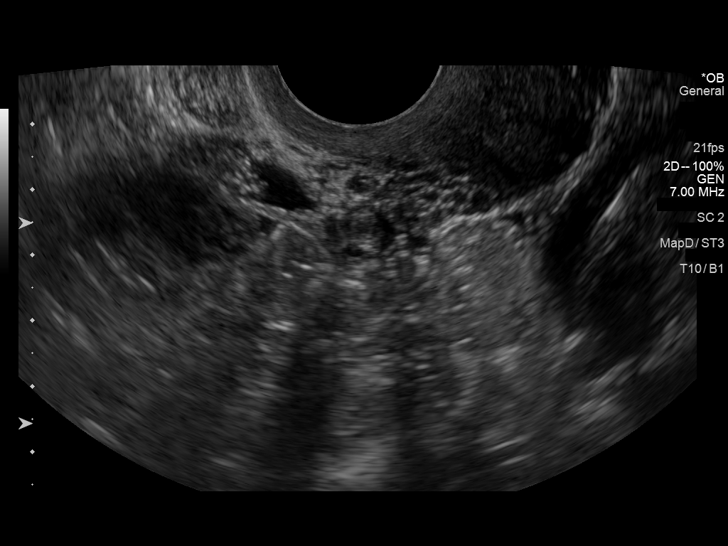
[im 14/34]
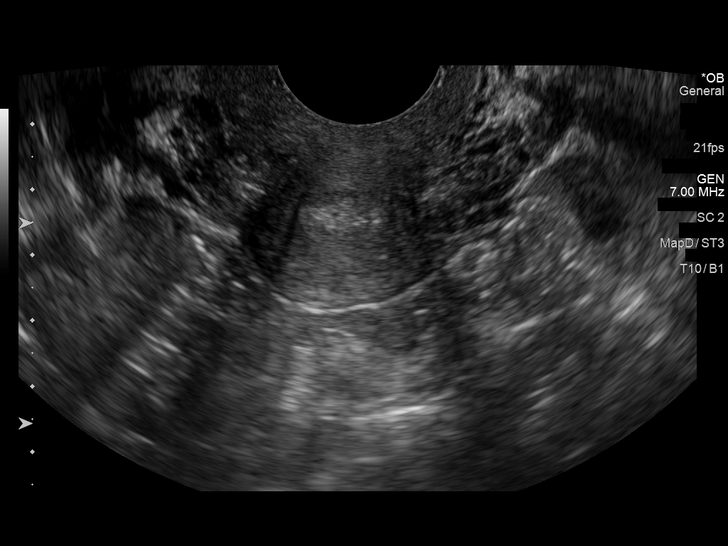
[im 16/34]
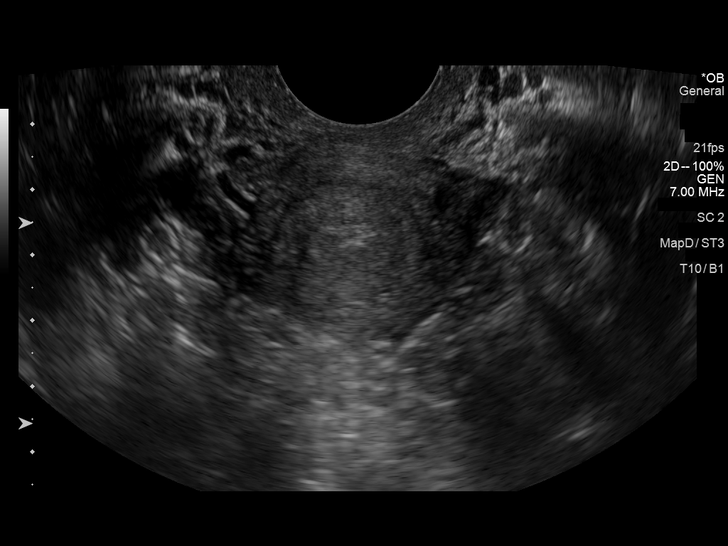
[im 19/34]
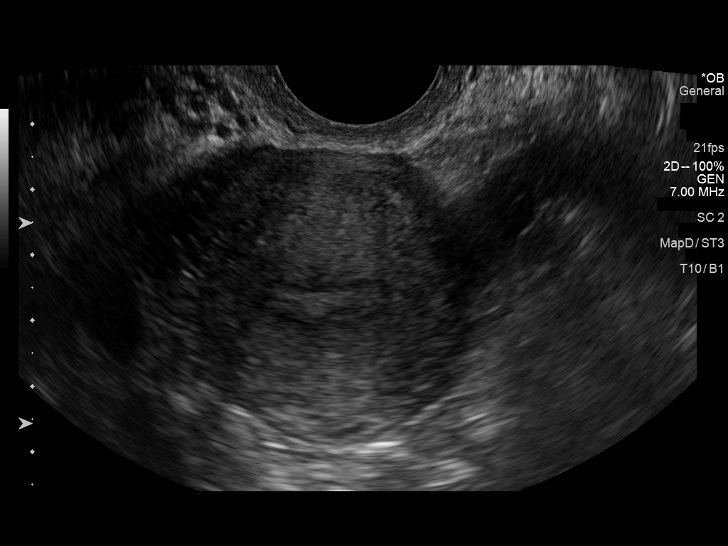
[im 21/34]
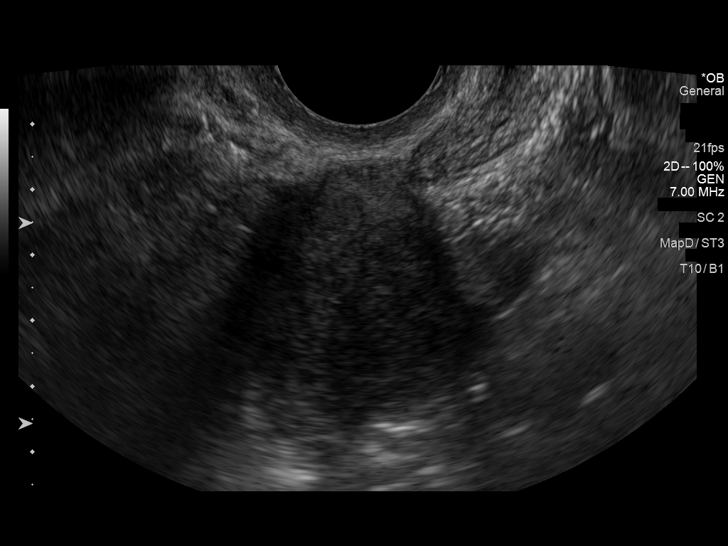
[im 24/34]
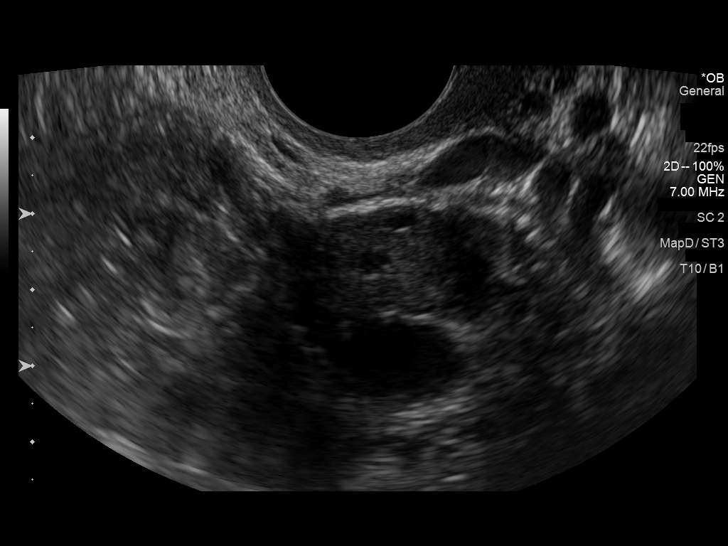
[im 26/34]
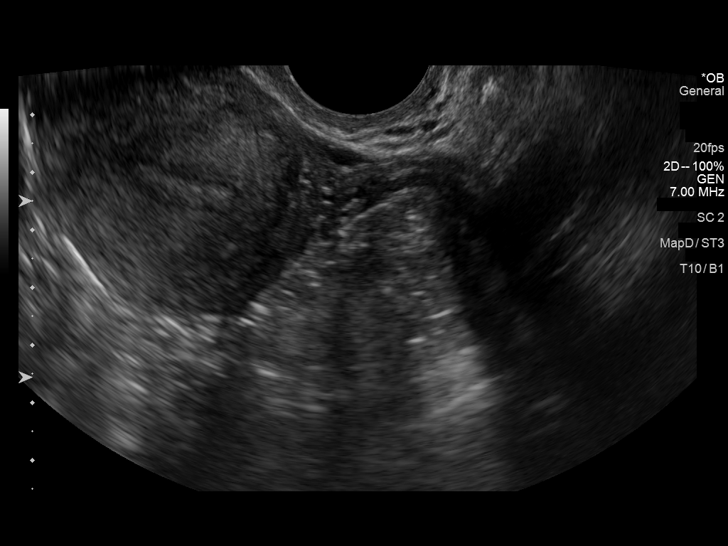
[im 29/34]
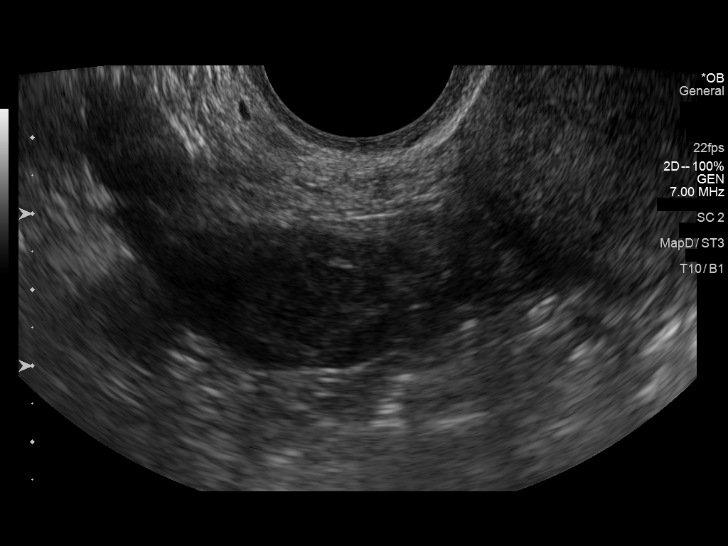
[im 31/34]
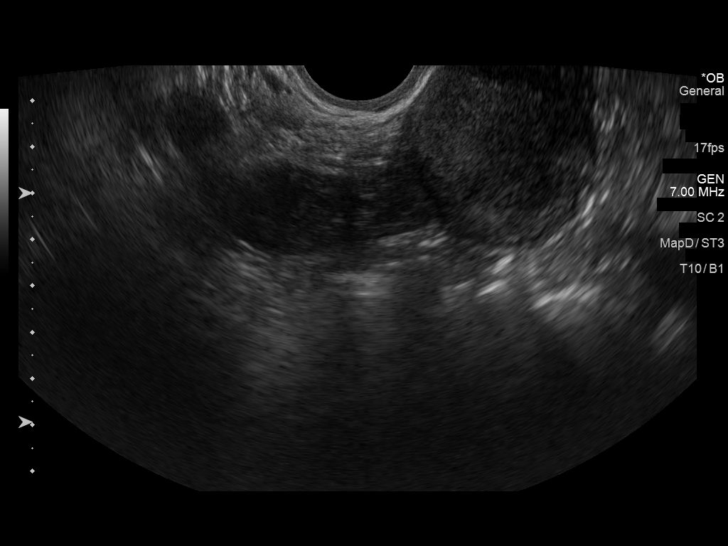
[im 34/34]
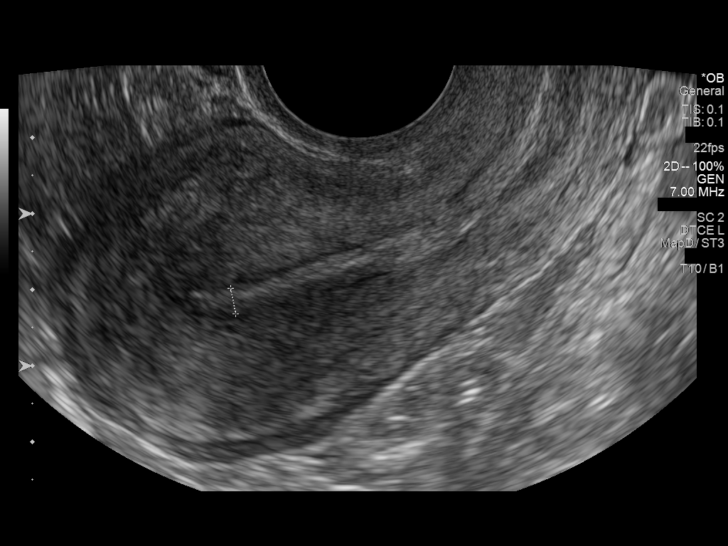

[14 of 28 positions shown; findings below may reference images not displayed]

FINDINGS: Intrauterine gestational sac: None visualized. 6 mm cystic structure
in the upper endometrium on the prior ultrasound is no longer seen.

Maternal uterus/adnexae: Uterus, endometrium, and ovaries are
unremarkable in appearance. Small amount of free fluid is noted in
the pelvis.
IMPRESSION: No intrauterine gestation identified. Small cystic structure in the
endometrium on the prior study is no longer seen. Small amount of
pelvic free fluid without ectopic pregnancy identified.

## 2015-11-12 IMAGING — US US ART/VEN ABD/PELV/SCROTUM DOPPLER LTD
1 series · 13 of 25 positions shown · non-contrast
Comparison: None.

CLINICAL DATA: Miscarriage 2 days ago, vaginal bleeding, sharp
right lower quadrant pain



[Series 1: us art/ven abd/pelv/scrotum doppler ltd · 0.18mm/px · 13 of 77 slices shown]
[im 1/77]
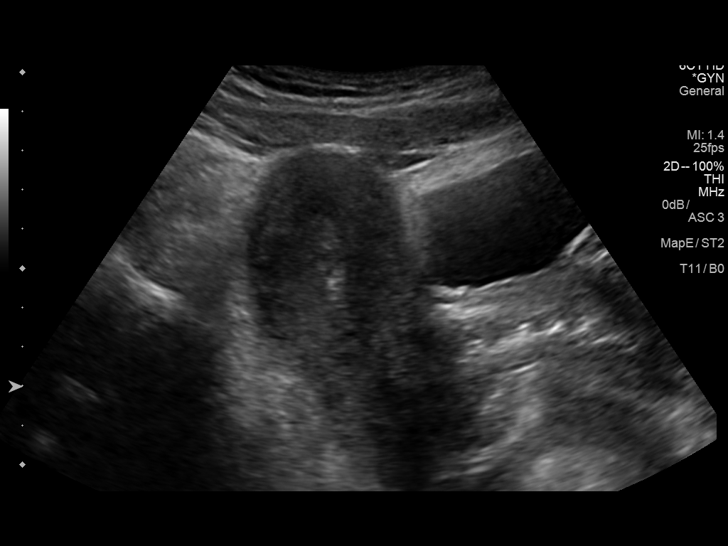
[im 7/77]
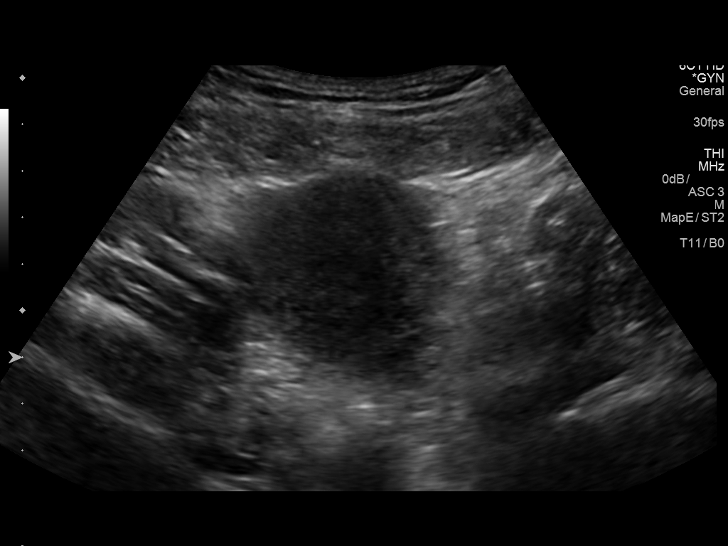
[im 13/77]
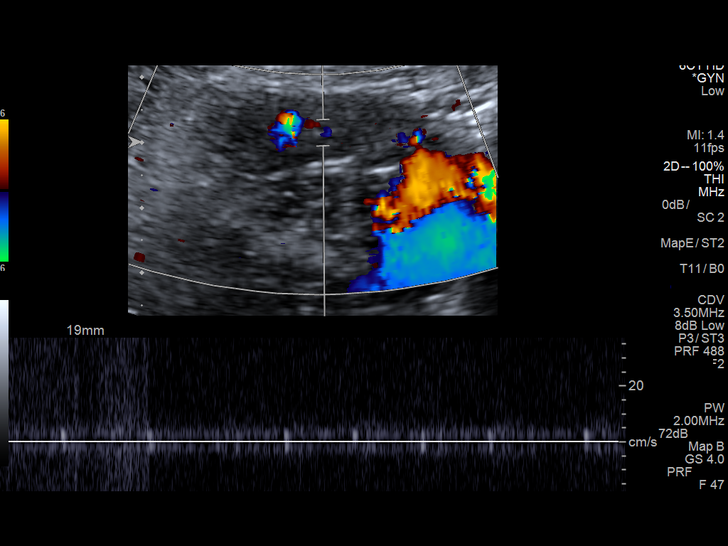
[im 20/77]
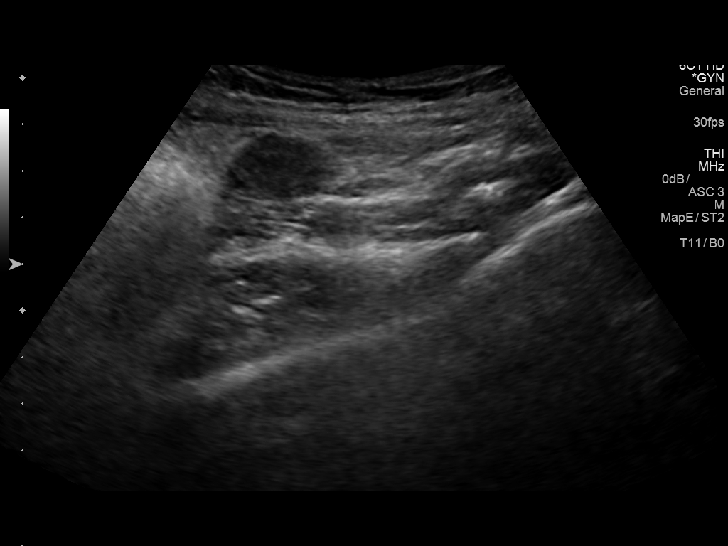
[im 26/77]
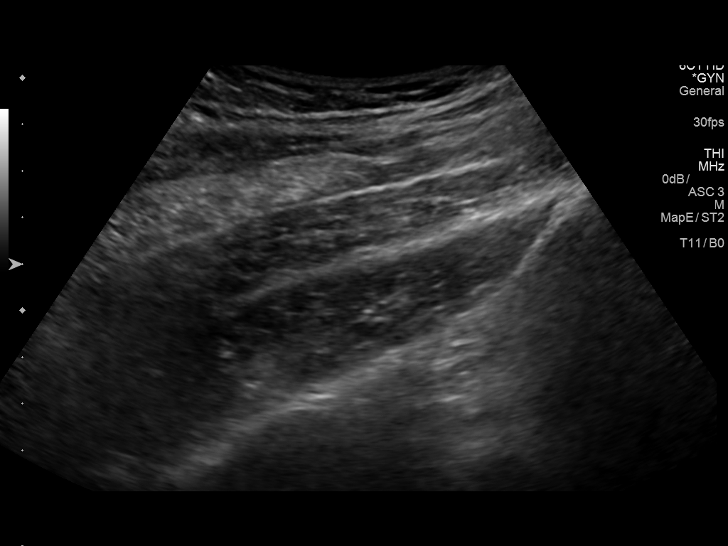
[im 32/77]
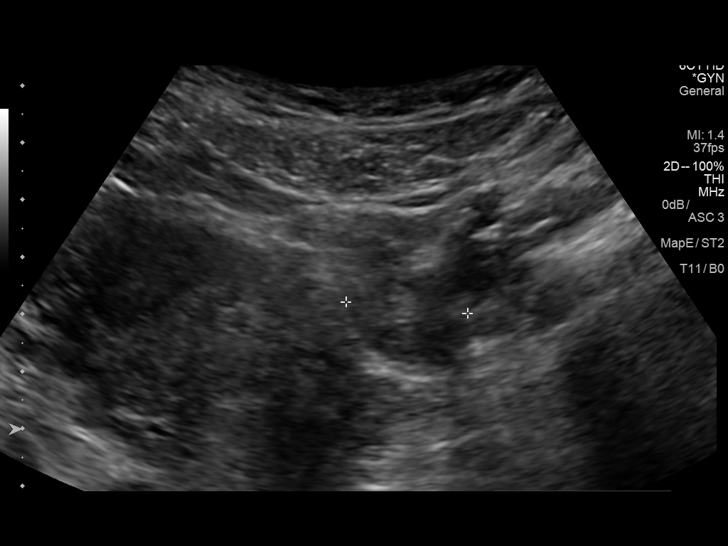
[im 39/77]
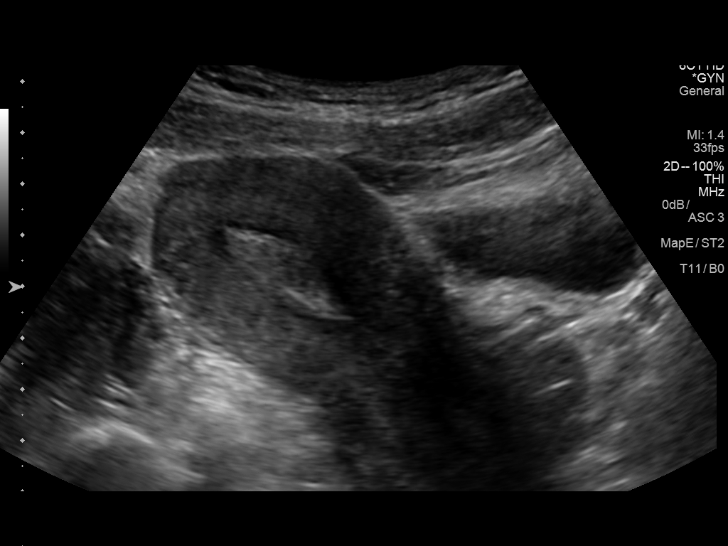
[im 45/77]
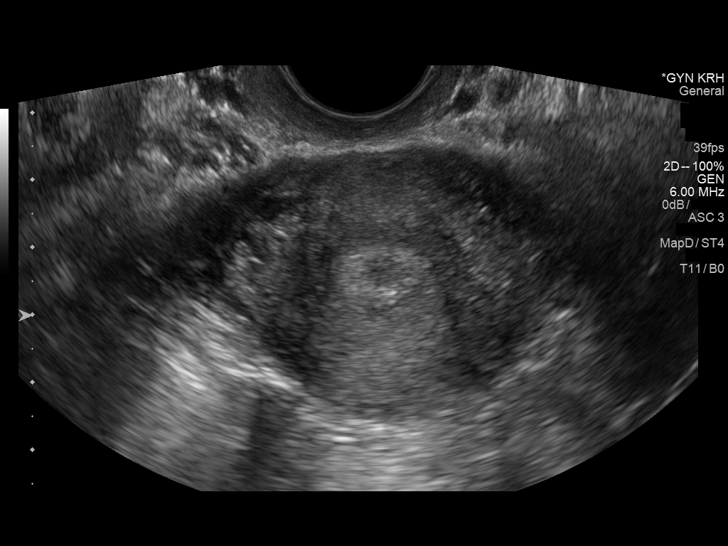
[im 51/77]
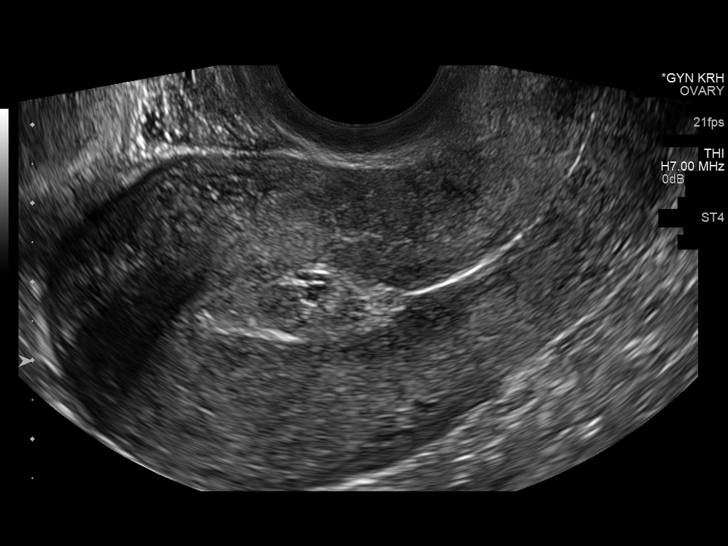
[im 58/77]
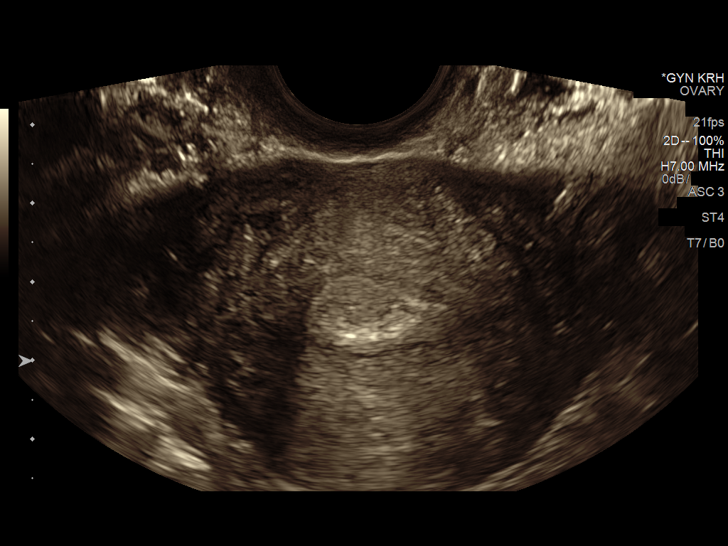
[im 64/77]
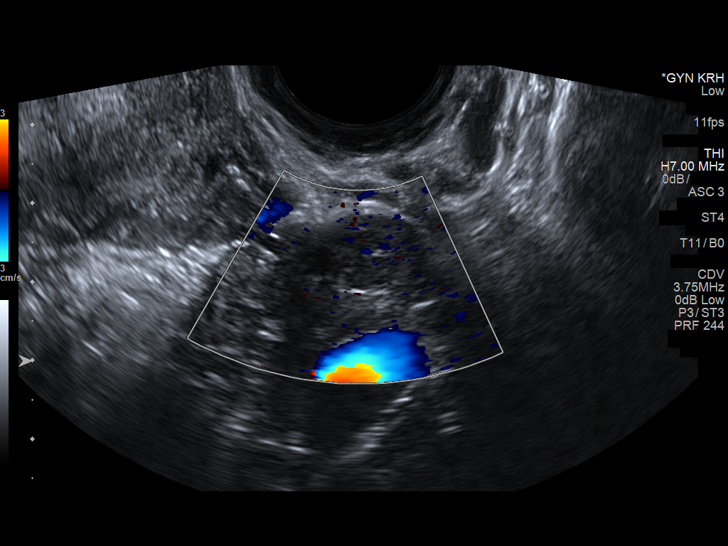
[im 70/77]
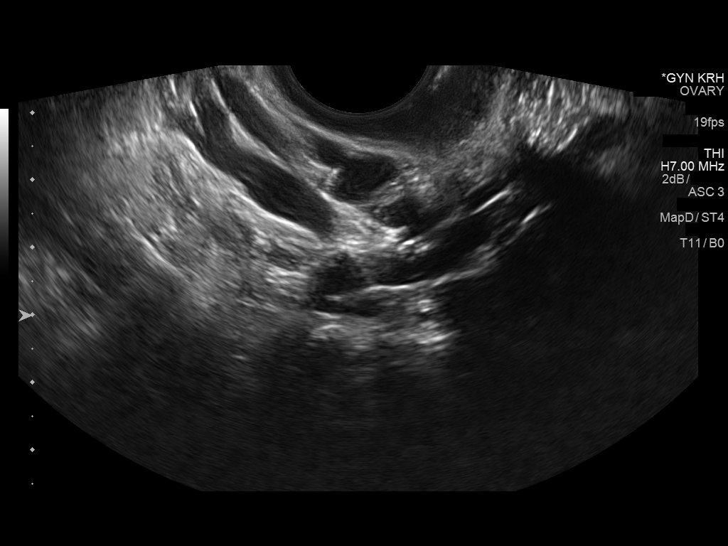
[im 77/77]
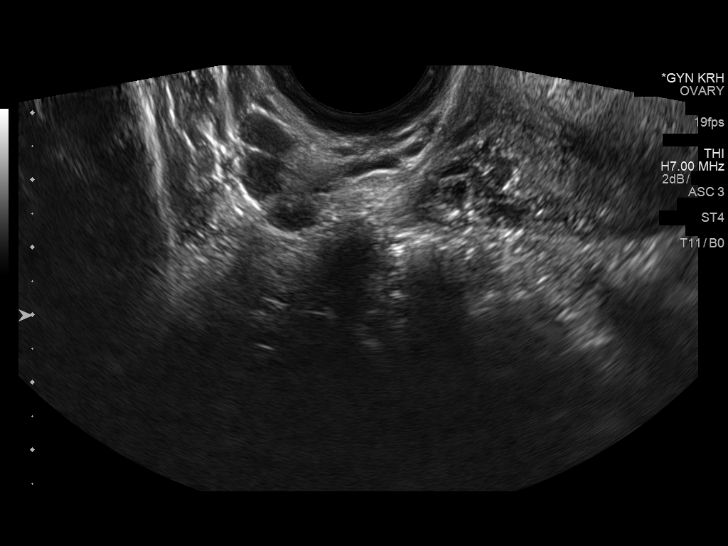

[13 of 25 positions shown; findings below may reference images not displayed]

FINDINGS: Intrauterine gestational sac: Not visualized

Maternal uterus/adnexae: Endometrium is focally
thickened/heterogeneous, measuring 9 mm, without color Doppler flow.
In the setting of recent miscarriage, this most likely reflects
blood products.

Left ovary is within normal limits, measuring 2.7 x 1.4 x 2.0 cm.
1.1 cm left ovarian corpus luteal cyst.

Right ovary is only visualized transabdominally but is within normal
limits, measuring 3.1 x 1.4 x 2.7 cm.

Trace pelvic fluid.

Pulsed Doppler evaluation of both ovaries demonstrates normal
appearing low-resistance arterial and venous waveforms.
IMPRESSION: No IUP is visualized. By definition, this reflects a pregnancy of
unknown location. Differential considerations include early normal
IUP, abnormal IUP/missed abortion, or nonvisualized ectopic
pregnancy. In this clinical setting, this appearance is compatible
with the presumed diagnosis of recent miscarriage.

Endometrium is focally thickened/ heterogeneous, measuring 9 mm,
without color Doppler flow. In the setting of recent miscarriage,
this most likely reflects blood products. If abnormal bleeding
persists, and beta HCG remains elevated, consider follow-up pelvic
ultrasound in 10-14 days to exclude retained products of conception.

No evidence of ovarian torsion.

## 2016-08-13 ENCOUNTER — Other Ambulatory Visit: Payer: Self-pay | Admitting: Obstetrics and Gynecology

## 2016-08-14 LAB — CYTOLOGY - PAP
